# Patient Record
Sex: Female | Born: 1935 | Race: Black or African American | Hispanic: No | Smoking: Never smoker
Health system: Southern US, Community
[De-identification: ages and names within clinical notes are randomized; demographics above are authoritative.]

## PROBLEM LIST (undated history)

## (undated) DIAGNOSIS — I10 Essential (primary) hypertension: Secondary | ICD-10-CM

## (undated) DIAGNOSIS — K219 Gastro-esophageal reflux disease without esophagitis: Secondary | ICD-10-CM

## (undated) DIAGNOSIS — I00 Rheumatic fever without heart involvement: Secondary | ICD-10-CM

## (undated) DIAGNOSIS — M47812 Spondylosis without myelopathy or radiculopathy, cervical region: Secondary | ICD-10-CM

## (undated) DIAGNOSIS — E669 Obesity, unspecified: Secondary | ICD-10-CM

## (undated) DIAGNOSIS — I509 Heart failure, unspecified: Secondary | ICD-10-CM

## (undated) DIAGNOSIS — I2699 Other pulmonary embolism without acute cor pulmonale: Secondary | ICD-10-CM

## (undated) DIAGNOSIS — E785 Hyperlipidemia, unspecified: Secondary | ICD-10-CM

## (undated) HISTORY — DX: Gastro-esophageal reflux disease without esophagitis: K21.9

## (undated) HISTORY — DX: Rheumatic fever without heart involvement: I00

## (undated) HISTORY — DX: Obesity, unspecified: E66.9

## (undated) HISTORY — DX: Hyperlipidemia, unspecified: E78.5

## (undated) HISTORY — DX: Spondylosis without myelopathy or radiculopathy, cervical region: M47.812

## (undated) HISTORY — DX: Essential (primary) hypertension: I10

## (undated) HISTORY — DX: Other pulmonary embolism without acute cor pulmonale: I26.99

## (undated) HISTORY — PX: HERNIA REPAIR: SHX51

## (undated) HISTORY — DX: Heart failure, unspecified: I50.9

---

## 2000-12-11 ENCOUNTER — Ambulatory Visit (HOSPITAL_COMMUNITY): Admission: RE | Admit: 2000-12-11 | Discharge: 2000-12-11 | Payer: Self-pay | Admitting: Family Medicine

## 2000-12-11 ENCOUNTER — Encounter: Payer: Self-pay | Admitting: Family Medicine

## 2002-12-19 ENCOUNTER — Ambulatory Visit (HOSPITAL_COMMUNITY): Admission: RE | Admit: 2002-12-19 | Discharge: 2002-12-19 | Payer: Self-pay | Admitting: Family Medicine

## 2006-02-10 ENCOUNTER — Ambulatory Visit (HOSPITAL_COMMUNITY): Admission: RE | Admit: 2006-02-10 | Discharge: 2006-02-10 | Payer: Self-pay | Admitting: Family Medicine

## 2006-07-31 ENCOUNTER — Ambulatory Visit (HOSPITAL_COMMUNITY): Admission: RE | Admit: 2006-07-31 | Discharge: 2006-07-31 | Payer: Self-pay | Admitting: Family Medicine

## 2007-08-22 ENCOUNTER — Emergency Department (HOSPITAL_COMMUNITY): Admission: EM | Admit: 2007-08-22 | Discharge: 2007-08-22 | Payer: Self-pay | Admitting: Emergency Medicine

## 2007-10-05 ENCOUNTER — Ambulatory Visit (HOSPITAL_COMMUNITY): Admission: RE | Admit: 2007-10-05 | Discharge: 2007-10-05 | Payer: Self-pay | Admitting: Family Medicine

## 2008-01-24 ENCOUNTER — Ambulatory Visit (HOSPITAL_COMMUNITY): Admission: RE | Admit: 2008-01-24 | Discharge: 2008-01-24 | Payer: Self-pay | Admitting: Family Medicine

## 2008-02-10 ENCOUNTER — Encounter: Payer: Self-pay | Admitting: Orthopedic Surgery

## 2008-02-10 ENCOUNTER — Ambulatory Visit (HOSPITAL_COMMUNITY): Admission: RE | Admit: 2008-02-10 | Discharge: 2008-02-10 | Payer: Self-pay | Admitting: Family Medicine

## 2008-02-23 ENCOUNTER — Encounter: Admission: RE | Admit: 2008-02-23 | Discharge: 2008-02-23 | Payer: Self-pay | Admitting: Family Medicine

## 2008-03-13 ENCOUNTER — Encounter: Admission: RE | Admit: 2008-03-13 | Discharge: 2008-03-13 | Payer: Self-pay | Admitting: Family Medicine

## 2008-03-13 ENCOUNTER — Encounter: Payer: Self-pay | Admitting: Orthopedic Surgery

## 2008-03-29 ENCOUNTER — Ambulatory Visit: Payer: Self-pay | Admitting: Cardiology

## 2008-03-29 ENCOUNTER — Inpatient Hospital Stay (HOSPITAL_COMMUNITY): Admission: AD | Admit: 2008-03-29 | Discharge: 2008-04-09 | Payer: Self-pay | Admitting: Family Medicine

## 2008-03-29 ENCOUNTER — Ambulatory Visit (HOSPITAL_COMMUNITY): Admission: RE | Admit: 2008-03-29 | Discharge: 2008-03-29 | Payer: Self-pay | Admitting: Family Medicine

## 2008-03-30 ENCOUNTER — Encounter (INDEPENDENT_AMBULATORY_CARE_PROVIDER_SITE_OTHER): Payer: Self-pay | Admitting: Family Medicine

## 2008-04-04 ENCOUNTER — Ambulatory Visit: Payer: Self-pay | Admitting: Oncology

## 2008-07-12 ENCOUNTER — Ambulatory Visit (HOSPITAL_COMMUNITY): Admission: RE | Admit: 2008-07-12 | Discharge: 2008-07-12 | Payer: Self-pay | Admitting: Family Medicine

## 2008-07-27 ENCOUNTER — Ambulatory Visit (HOSPITAL_COMMUNITY): Admission: RE | Admit: 2008-07-27 | Discharge: 2008-07-27 | Payer: Self-pay | Admitting: Family Medicine

## 2008-10-23 ENCOUNTER — Encounter (INDEPENDENT_AMBULATORY_CARE_PROVIDER_SITE_OTHER): Payer: Self-pay | Admitting: Family Medicine

## 2008-10-23 ENCOUNTER — Ambulatory Visit: Payer: Self-pay | Admitting: Cardiology

## 2008-10-23 ENCOUNTER — Ambulatory Visit (HOSPITAL_COMMUNITY): Admission: RE | Admit: 2008-10-23 | Discharge: 2008-10-23 | Payer: Self-pay | Admitting: Family Medicine

## 2009-01-09 ENCOUNTER — Ambulatory Visit (HOSPITAL_COMMUNITY): Admission: RE | Admit: 2009-01-09 | Discharge: 2009-01-09 | Payer: Self-pay | Admitting: Family Medicine

## 2009-01-18 ENCOUNTER — Ambulatory Visit: Payer: Self-pay | Admitting: Orthopedic Surgery

## 2009-01-18 DIAGNOSIS — M25519 Pain in unspecified shoulder: Secondary | ICD-10-CM

## 2009-06-06 ENCOUNTER — Encounter (INDEPENDENT_AMBULATORY_CARE_PROVIDER_SITE_OTHER): Payer: Self-pay | Admitting: *Deleted

## 2009-06-06 LAB — CONVERTED CEMR LAB
ALT: 13 units/L
Albumin: 4.3 g/dL
Chloride: 105 meq/L
Creatinine, Ser: 1.14 mg/dL
HCT: 38.3 %
HDL: 48 mg/dL
Hemoglobin: 12.2 g/dL
LDL Cholesterol: 84 mg/dL
Platelets: 260 10*3/uL
Potassium: 3.6 meq/L
Triglycerides: 105 mg/dL

## 2009-06-14 ENCOUNTER — Encounter: Payer: Self-pay | Admitting: Cardiology

## 2009-06-21 ENCOUNTER — Ambulatory Visit: Payer: Self-pay | Admitting: Orthopedic Surgery

## 2009-06-21 DIAGNOSIS — M7580 Other shoulder lesions, unspecified shoulder: Secondary | ICD-10-CM

## 2009-06-28 ENCOUNTER — Encounter (INDEPENDENT_AMBULATORY_CARE_PROVIDER_SITE_OTHER): Payer: Self-pay | Admitting: *Deleted

## 2009-07-05 ENCOUNTER — Ambulatory Visit: Payer: Self-pay | Admitting: Cardiology

## 2009-07-05 ENCOUNTER — Encounter (INDEPENDENT_AMBULATORY_CARE_PROVIDER_SITE_OTHER): Payer: Self-pay | Admitting: *Deleted

## 2009-07-05 ENCOUNTER — Telehealth (INDEPENDENT_AMBULATORY_CARE_PROVIDER_SITE_OTHER): Payer: Self-pay

## 2009-07-05 DIAGNOSIS — R0602 Shortness of breath: Secondary | ICD-10-CM

## 2009-07-05 DIAGNOSIS — E669 Obesity, unspecified: Secondary | ICD-10-CM

## 2009-07-05 DIAGNOSIS — K219 Gastro-esophageal reflux disease without esophagitis: Secondary | ICD-10-CM | POA: Insufficient documentation

## 2009-07-05 DIAGNOSIS — E785 Hyperlipidemia, unspecified: Secondary | ICD-10-CM | POA: Insufficient documentation

## 2009-07-05 LAB — CONVERTED CEMR LAB
Brain Natriuretic Peptide: 11.1
TSH: 1.618 microintl units/mL

## 2009-07-06 ENCOUNTER — Encounter: Payer: Self-pay | Admitting: Cardiology

## 2009-07-13 ENCOUNTER — Ambulatory Visit: Payer: Self-pay | Admitting: Internal Medicine

## 2009-07-13 ENCOUNTER — Ambulatory Visit (HOSPITAL_COMMUNITY): Admission: RE | Admit: 2009-07-13 | Discharge: 2009-07-13 | Payer: Self-pay | Admitting: Cardiology

## 2009-07-13 ENCOUNTER — Encounter: Payer: Self-pay | Admitting: Cardiology

## 2009-07-18 ENCOUNTER — Encounter: Payer: Self-pay | Admitting: Cardiology

## 2009-07-27 ENCOUNTER — Encounter: Payer: Self-pay | Admitting: Cardiology

## 2009-08-03 ENCOUNTER — Encounter: Payer: Self-pay | Admitting: *Deleted

## 2009-08-06 ENCOUNTER — Encounter (INDEPENDENT_AMBULATORY_CARE_PROVIDER_SITE_OTHER): Payer: Self-pay | Admitting: *Deleted

## 2009-08-06 ENCOUNTER — Ambulatory Visit: Payer: Self-pay | Admitting: Cardiology

## 2009-08-07 DIAGNOSIS — I2699 Other pulmonary embolism without acute cor pulmonale: Secondary | ICD-10-CM

## 2009-08-13 ENCOUNTER — Ambulatory Visit: Payer: Self-pay | Admitting: Cardiology

## 2009-08-13 ENCOUNTER — Encounter (HOSPITAL_COMMUNITY): Admission: RE | Admit: 2009-08-13 | Discharge: 2009-08-13 | Payer: Self-pay | Admitting: Cardiology

## 2009-08-22 ENCOUNTER — Encounter (INDEPENDENT_AMBULATORY_CARE_PROVIDER_SITE_OTHER): Payer: Self-pay | Admitting: *Deleted

## 2009-08-28 ENCOUNTER — Ambulatory Visit: Payer: Self-pay | Admitting: Cardiology

## 2009-09-24 ENCOUNTER — Encounter: Payer: Self-pay | Admitting: *Deleted

## 2009-09-24 LAB — CONVERTED CEMR LAB
CO2: 26 meq/L (ref 19–32)
Calcium: 9.5 mg/dL
Chloride: 107 meq/L (ref 96–112)
Creatinine, Ser: 0.9 mg/dL (ref 0.40–1.20)
Potassium: 3.7 meq/L
Sodium: 142 meq/L (ref 135–145)

## 2009-10-01 ENCOUNTER — Encounter (INDEPENDENT_AMBULATORY_CARE_PROVIDER_SITE_OTHER): Payer: Self-pay | Admitting: *Deleted

## 2009-10-16 ENCOUNTER — Encounter: Admission: RE | Admit: 2009-10-16 | Discharge: 2009-10-16 | Payer: Self-pay | Admitting: Family Medicine

## 2010-02-03 ENCOUNTER — Encounter: Payer: Self-pay | Admitting: Family Medicine

## 2010-02-04 ENCOUNTER — Encounter: Payer: Self-pay | Admitting: Family Medicine

## 2010-02-12 NOTE — Assessment & Plan Note (Signed)
Summary: rt shoulder pain/needs xr,mri [facility]/medicare/medicaid/[place]/[facility]f   Vital Signs:  Patient profile:   77 year old female Weight:      172 pounds Pulse rate:   80 / minute Resp:     18 per minute  Vitals Entered By: Fuller Canada MD (January 18, 2009 10:39 AM)  Visit Type:  Initial Consult Referring Provider:  Dr. Sudie Bailey Primary Provider:  Dr. Sudie Bailey  CC:  right shoulder pain.  History of Present Illness: I saw [name] in the office today for an initial visit.  She is a 77 years old woman with the complaint of:  chief complaint:  Right shoulder pain, Consult [place].  77 year old female with RIGHT shoulder pain for approximately 1 year. No injury. The pain started in the neck and then traveled to the shoulder. The pain radiates from the shoulder into the deltoid region and it is about a 6/10 worst with raising her arm improved Lorcet plus. She describes the pain. No stiffness in the shoulder. No arm weakness. No catching, locking, or popping. No numbness in the hand or arm.  she does have some neck pain. She is already had an MRI in January of 2010. She had 2 epidural injections, which helped her neck pain.  -xrays done & where:  will have shoulder xrays in our office today.  No injections or therapy.  Meds: Atenox/chlor, Burlene Arnt Kon, Simvastatin, Prednisone, Torsemide, Spironolactone, Nexium, Hydromorphone, Lorcet plus.   Allergies (verified): No Known Drug Allergies  Past History:  Past Medical History: neck pain short of breath htn cholesterol asthma reflux  Past Surgical History: hernia of stomach  Family History: Family History of Diabetes Family History of Arthritis  Social History: Patient is widowed.  retired no smoking no alcohol no caffeine  Review of Systems General:  Complains of fatigue; denies weight loss, weight gain, fever, and chills. Cardiac :  Complains of blood clots; denies chest pain, angina, heart  attack, heart failure, poor circulation, and phlebitis. Resp:  Complains of short of breath; denies difficulty breathing, COPD, cough, and pneumonia. GI:  Complains of reflux; denies nausea, vomiting, diarrhea, constipation, difficulty swallowing, ulcers, and GERD. GU:  Denies kidney failure, kidney transplant, kidney stones, burning, poor stream, testicular cancer, blood in urine, and . Neuro:  Denies headache, dizziness, migraines, numbness, weakness, tremor, and unsteady walking. MS:  Complains of joint pain; denies rheumatoid arthritis, joint swelling, gout, bone cancer, osteoporosis, and . Endo:  Denies thyroid disease, goiter, and diabetes. Psych:  Denies depression, mood swings, anxiety, panic attack, bipolar, and schizophrenia. Derm:  Denies eczema, cancer, and itching. EENT:  Denies poor vision, cataracts, glaucoma, poor hearing, vertigo, ears ringing, sinusitis, hoarseness, toothaches, and bleeding gums. Immunology:  Denies seasonal allergies, sinus problems, and allergic to bee stings. Lymphatic:  Denies lymph node cancer and lymph edema.  Physical Exam  Msk:  Vital signs are stable as recorded.  General appearance is normal.  She has normal pulse, and effusion. Both upper extremities.  Skin in both arms. Normal.  No lymphadenopathy seen along the cervical spine or either axilla.  Patient is alert and oriented x3. Her mood and affect are normal. She has normal reflexes, and coordination and sensation in both upper extremities.  Musculoskeletal findings. She ambulates normally.  She has decreased range of motion in the RIGHT shoulder normal and the LEFT is painful crepitance on range of motion with a positive impingement sign on the RIGHT. There is weakness of the rotator cuff. Grade 5, minus over  5. Shoulder is stable.  The LEFT shoulder as stated has full range of motion without crepitus. Strength is normal. Stability is intact. No tenderness or swelling.   Impression &  Recommendations:  Problem # 1:  IMPINGEMENT SYNDROME (ICD-726.2) Assessment New  Orders: Consultation Level III ([address]) Joint Aspirate / Injection, Large (20610) Depo- Medrol 40mg  (J1030) Shoulder x-ray,  minimum 2 views (60454) normal appearing glenohumeral joint and acromion.  Normal RIGHT shoulder.  Injection RIGHT shoulder Verbal consent obtained/The shoulder was injected with depomedrol 40mg /cc and sensorcaine .25% . There were no complications  Problem # 2:  SHOULDER PAIN (ICD-719.41) Assessment: New  Orders: Consultation Level III (09811) Joint Aspirate / Injection, Large (20610) Depo- Medrol 40mg  (J1030) Shoulder x-ray,  minimum 2 views (91478)  Patient Instructions: 1)  You have received an injection of cortisone today. You may experience increased pain at the injection site. Apply ice pack to the area for 20 minutes every 2 hours and take 2 xtra strength tylenol every 8 hours. This increased pain will usually resolve in 24 hours. The injection will take effect in 3-10 days.  2)  Do exercises for 6 weeks

## 2010-02-12 NOTE — Letter (Signed)
Summary: PROGRESS NOTE 06-05-09 DR KNLOWTON  PROGRESS NOTE 06-05-09 DR KNLOWTON   Imported By: Faythe Ghee 06/14/2009 11:39:56  _____________________________________________________________________  External Attachment:    Type:   Image     Comment:   External Document

## 2010-02-12 NOTE — Letter (Signed)
Summary: [place] [facility], agricultural at [facility]  [address], [place] [address]   Phone: [phone]  Fax: [phone]     August 28, 2009 MRN: [id number]   [name] [address] [place], [place]  [address]      YOUR LAB WORK IS DUE  September 24, 2009 _________________________________________  Please go to [facility], located across the street from [facility] on the second floor.  Hours are Monday - Friday 7am until 7:30pm         Saturday 8am until 12noon    __  DO NOT EAT OR DRINK AFTER MIDNIGHT EVENING PRIOR TO LABWORK  _X_ YOUR LABWORK IS NOT FASTING --YOU MAY EAT PRIOR TO LABWORK

## 2010-02-12 NOTE — Assessment & Plan Note (Signed)
Summary: **NP6 CHEST TIGHTNESS A WEAK   Visit Type:  Follow-up Referring Provider:  Dr. Sudie Bailey Primary Provider:  Dr. Sudie Bailey   History of Present Illness: [name] is seen at the kind request of Dr. Sudie Bailey for evaluation and treatment of dyspnea and fatigue on exertion.  This nice woman has no history of cardiac problems nor has she ever been evaluated by a cardiologist.  Her only serious medical illness occurred in March of 2010 point she presented with dyspnea and bilateral pulmonary infiltrates and was found to have multiple pulmonary emboli.  She has done well since then, but has continued to have exertional dyspnea at the class 2-3 level and fatigue.  She experiences no chest discomfort.  She has no orthopnea nor PND.  She has not noted weight gain or pedal edema.  She is maintained on Coumadin as managed by Dr. Sudie Bailey.  Preventive Screening-Counseling & Management  Alcohol-Tobacco     Smoking Status: never  Current Medications (verified): 1)  Norco 5-325 Mg Tabs (Hydrocodone-Acetaminophen) .Marland Kitchen.. 1 Q 4 As Needed Pain 2)  Atenolol-Chlorthalidone 50-25 Mg Tabs (Atenolol-Chlorthalidone) .... Take 1 Tab Daily 3)  Losartan Potassium 100 Mg Tabs (Losartan Potassium) .... Take 1 Tab Daily 4)  Klor-Con 10 10 Meq Cr-Tabs (Potassium Chloride) .... Take 1 Tab Two Times A Day 5)  Simvastatin 40 Mg Tabs (Simvastatin) .... Take 1 Tab Daily 6)  Nexium 40 Mg Cpdr (Esomeprazole Magnesium) .... Take 1 Tab Daily 7)  Torsemide 100 Mg Tabs (Torsemide) .... Take 1/2 Tab Daily 8)  Spironolactone 25 Mg Tabs (Spironolactone) .... Take 1 Tab Daily  Allergies (verified): No Known Drug Allergies  Past History:  Family History: Last updated: August 01, 2009 Father died at age 62 as the result of a CVA Mother died at age 60, also from CVA Siblings: Of 3 brothers, one died due to myocardial infarction, one secondary to cirrhosis and one as the result of trauma.  5 sisters, one died in  childbirth and one due to myocardial infarction.  3 remain alive.  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  5 sisters, one died in childbirth and one due to myocardial infarction.  3 remain alive.  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  Echocardiogram  Procedure date:  10/23/2008  Findings:      LV-mild LVH; normal systolic function with an EF of 55-60% Possible diastolic flattening of the septum Borderline mitral valve prolapse without significant M. or Mild right ventricular dilatation; mild RVH; normal systolic function.   Review of Systems       Requires corrective lenses; upper dentures; occasional palpitations; gastroesophageal reflux disease symptoms; diffuse arthritic discomfort; intermittent pedal edema.  All of the systems reviewed and are negative.  Vital Signs:  Patient profile:   77 year old female Height:      63 inches Weight:      170 pounds BMI:     30.22 O2 Sat:      92 % on Room air Pulse rate:   70 / minute BP sitting:   161 / 82  (right arm)  Vitals Entered By: Teressa Lower RN (July 05, 2009 1:58 PM)  O2 Flow:  Room air  Physical Exam  General:  Overweight; well-developed; no acute distress: HEENT-[place]/AT; PERRL; EOM intact; conjunctiva and lids nl:  Neck-No JVD; no carotid bruits: Endocrine-mild thyromegaly: Lungs-No tachypnea, clear without rales, rhonchi or wheezes: CV-normal PMI; normal S1 and S2; minimal systolic murmur Abdomen-BS normal; soft and non-tender without masses or organomegaly: MS-No deformities, cyanosis or clubbing: Neurologic-Nl cranial nerves; symmetric strength and tone: Skin- Warm, no sig. lesions: Extremities-Nl distal pulses; 1/2+ ankle edema    Impression & Recommendations:  Problem # 1:  DYSPNEA (ICD-786.05) There is nothing on examination to suggest the presence of congestive heart failure.  When she was admitted to hospital in 2009, BNP level was perfectly normal.  We will proceed with a repeat echocardiogram, a BNP level, other basic laboratory studies and a chest x-ray.  PFTs and an arterial blood gas will also be obtained.  She will likely require a stress test, but that will be deferred for the time being.  Problem # 2:  HYPERLIPIDEMIA (ICD-272.4)  Recent laboratory was good with total cholesterol 153, triglycerides 105, HDL 48 and LDL of 84.  Current therapy will be continued.  Problem # 3:  HYPERTENSION (ICD-401.1) Blood pressure control is somewhat suboptimal, and patient will likely need additional antihypertensive medication.  She may not need 3 different diuretics.  For now, she will monitor blood pressure at home or in local pharmacies and return in 3-4 weeks for review of testing to date, reassessment of the need for antihypertensive therapy and for management of any other issues that arise.  Other Orders: T-TSH [phone]) T-BNP  (B Natriuretic Peptide) [phone]) 2-D Echocardiogram (2D Echo) PFT Baseline (PFT Baseline)  Patient Instructions: 1)  Your physician recommends that you schedule a follow-up appointment  in: 3 to 4 weeks 2)  Your physician recommends that you return for lab work in: today 3)  Your physician has recommended that you have a pulmonary function test.  Pulmonary Function Tests are a group of tests that measure how well air moves in and out of your lungs. 4)  Your physician has recommended that you have ABG's performed along with PFT.  Prevention & Chronic Care Immunizations   Influenza vaccine: Not documented    Tetanus booster: Not documented    Pneumococcal vaccine: Not documented    H. zoster vaccine: Not documented  Colorectal Screening   Hemoccult: Not documented    Colonoscopy: Not documented  Other Screening   Pap smear: Not documented    Mammogram: Not documented    DXA bone density scan: Not documented   Smoking status: never  (07/05/2009)  Lipids   Total Cholesterol: 153  (06/06/2009)   LDL: 84  (06/06/2009)   LDL Direct: Not documented   HDL: 48  (06/06/2009)   Triglycerides: 105  (06/06/2009)    SGOT (AST): 14  (06/06/2009)   SGPT (ALT): 13  (06/06/2009)   Alkaline phosphatase: 54  (06/06/2009)   Total bilirubin: Not documented  Hypertension   Last Blood Pressure: 161 / 82  (07/05/2009)   Serum creatinine: 1.14  (06/06/2009)  Serum potassium 3.6  (06/06/2009)  Self-Management Support :    Hypertension self-management support: Not documented    Lipid self-management support: Not documented

## 2010-02-12 NOTE — Letter (Signed)
Summary: [place] [facility] (Nuc Med Stress)  [facility]  [address] [place] [address]   Phone: [phone]  Fax: [phone]    Nuclear Medicine 1-Day Stress Test Information Sheet  Re:     [name]   DOB:     07/16/1931 MRN:     [id number] Weight:  Appointment Date: Register at: Appointment Time: Referring MD:  _x__Exercise Stress  __Adenosine   __Dobutamine  __Lexiscan  __Persantine   __Thallium  Urgency: ____1 (next day)   ____2 (one week)    ____3 (PRN)  Patient will receive Follow Up call with results: Patient needs follow-up appointment:  Instructions regarding medication: Please do not take your atenolol/chlorthalidone,torsemide,spironolactone,amlodipine the morning of your exam  How to prepare for your stress test: 1. DO NOT eat or dring 6 hours prior to your arrival time. This includes no caffeine (coffee, tea, sodas, chocolate) if you were instructed to take your medications, drink water with it. 2. DO NOT use any tobacco products for at leaset 8 hours prior to arrival. 3. DO NOT wear dresses or any clothing that may have metal clasps or buttons. 4. Wear short sleeve shirts, loose clothing, and comfortalbe walking shoes. 5. DO NOT use lotions, oils or powder on your chest before the test. 6. The test will take approximately 3-4 hours from the time you arrive until completion. 7. To register the day of the test, go to the Short Stay entrance at [facility]. 8. If you must cancel your test, call [phone] as soon as you are aware.  After you arrive for test:   When you arrive at [facility], you will go to Short Stay to be registered. They will then send you to Radiology to check in. The Nuclear Medicine Tech will get you and start an IV in your arm or hand. A small amount of a radioactive tracer will then be injected into your IV. This tracer will then have to circulate for 30-45 minutes. During this time you will wait  in the waiting room and you will be able to drink something without caffeine. A series of pictures will be taken of your heart follwoing this waiting period. After the 1st set of pictures you will go to the stress lab to get ready for your stress test. During the stress test, another small amount of a radioactive tracer will be injected through your IV. When the stress test is complete, there is a short rest period while your heart rate and blood pressure will be monitored. When this monitoring period is complete you will have another set of pictrues taken. (The same as the 1st set of pictures). These pictures are taken between 15 minutes and 1 hour after the stress test. The time depends on the type of stress test you had. Your doctor will inform you of your test results within 7 days after test.    The possibilities of certain changes are possible during the test. They include abnormal blood pressure and disorders of the heart. Side effects of persantine or adenosine can include flushing, chest pain, shortness of breath, stomach tightness, headache and light-headedness. These side effects usually do not last long and are self-resolving. Every effort will be made to keep you comfortable and to minimize complications by obtaining a medical history and by close observation during the test. Emergency equipment, medications, and trained personnel are available to deal with any unusual situation which may arise.  Please notify office at  least 48 hours in advance if you are unable to keep this appt.

## 2010-02-12 NOTE — Assessment & Plan Note (Signed)
Summary: 3-4 wk f/u per checkout on 08/06/09/tg   Visit Type:  Follow-up Referring Provider:  . Primary Provider:  Dr. Sudie Bailey   History of Present Illness: [name] returns to the office as scheduled for continued assessment and treatment of exertional dyspnea and fatigue.  Since her last visit, she feels that she is improved.  She is more active with less dyspnea.  She denies orthopnea or PND.  She has noted very mild ankle edema.  She has not taken warfarin for the past 5 months after a one year course following pulmonary embolism.  She denies chest discomfort, calf enlargement or leg pain.  Current Medications (verified): 1)  Norco 5-325 Mg Tabs (Hydrocodone-Acetaminophen) .Marland Kitchen.. 1 Q 4 As Needed Pain 2)  Atenolol-Chlorthalidone 50-25 Mg Tabs (Atenolol-Chlorthalidone) .... Take 1 Tab Daily 3)  Losartan Potassium 100 Mg Tabs (Losartan Potassium) .... Take 1 Tab Daily 4)  Klor-Con 10 10 Meq Cr-Tabs (Potassium Chloride) .... Take 2 Tab Two Times A Day 5)  Pravastatin Sodium 40 Mg Tabs (Pravastatin Sodium) .... Take One Tablet By Mouth Daily At Bedtime 6)  Nexium 40 Mg Cpdr (Esomeprazole Magnesium) .... Take 1 Tab Daily 7)  Torsemide 100 Mg Tabs (Torsemide) .... Take 1/2 Tab Daily 8)  Spironolactone 25 Mg Tabs (Spironolactone) .... Take 1 Tab Daily 9)  Tylenol Extra Strength 500 Mg Tabs (Acetaminophen) .... Take 1 Tab Daily 10)  Amlodipine Besylate 5 Mg Tabs (Amlodipine Besylate) .... Take One Tablet By Mouth Daily  Allergies (verified): No Known Drug Allergies  Past History:  PMH, FH, and Social History reviewed and updated.  Review of Systems       See history of present illness.  Vital Signs:  Patient profile:   77 year old female Weight:      170 pounds O2 Sat:      92 % on Room air Pulse rate:   69 / minute BP sitting:   134 / 77  (right arm)  Vitals Entered By: Dreama Saa, CNA (August 28, 2009 1:11 PM)  O2 Flow:  Room air  Physical Exam  General:   Overweight; well-developed; no acute distress: HEENT-[place]/AT; PERRL; EOM intact; conjunctiva and lids nl:  Neck-No JVD; no carotid bruits: Endocrine-mild thyromegaly: Lungs-No tachypnea, clear without rales, rhonchi or wheezes: CV-normal PMI; normal S1 and S2; modest systolic murmur Abdomen-BS normal; soft and non-tender without masses or organomegaly: MS-there is deformity of multiple MTP joints, no cyanosis or clubbing: Neurologic-Nl cranial nerves; symmetric strength and tone: Skin- Warm, no sig. lesions: Extremities-Nl distal pulses; 1/2+ ankle edema    Impression & Recommendations:  Problem # 1:  DYSPNEA (ICD-786.05) Symptoms most likely related to excessive weight and physical deconditioning.  There is no evidence for congestive heart failure.  The diagnosis of possible recurrent pulmonary embolism has not been pursued.  We will start with a d-dimer determination.  Problem # 2:  HYPERLIPIDEMIA (ICD-272.4)  Lipid profile was quite good when assessed 2 months ago; current medication will be continued.  Problem # 3:  HYPERTENSION (ICD-401.1) Blood pressure control is good; I will not change her current antihypertensive regimen.  Although she has very mild pedal edema, this is not troublesome to her nor has it progressed since amlodipine was added to her medical regime.  I will plan to reassess this nice woman in 6 weeks.  Other Orders: Future Orders: T-Basic Metabolic Panel [phone]) ... 09/24/2009 T-D-Dimer Fibrin Derivatives Quantitive [phone]) ... 09/24/2009  Patient Instructions: 1)  Your physician recommends that  you schedule a follow-up appointment in: 6 months 2)  Your physician recommends that you return for lab work in: 1 month 3)  Your physician discussed the importance of regular exercise and recommended that you start or continue a regular exercise program for good health. 4)  Your physician encouraged you to lose weight for better health.

## 2010-02-12 NOTE — Miscellaneous (Signed)
Summary: LABS CBCD,CMP,LIPIDS,06/06/2009  Clinical Lists Changes  Observations: Added new observation of CALCIUM: 10.0 mg/dL (19/14/7829 56:21) Added new observation of ALBUMIN: 4.3 g/dL (30/86/5784 69:62) Added new observation of PROTEIN, TOT: 6.3 g/dL (95/28/4132 44:01) Added new observation of SGPT (ALT): 13 units/L (06/06/2009 16:20) Added new observation of SGOT (AST): 14 units/L (06/06/2009 16:20) Added new observation of ALK PHOS: 54 units/L (06/06/2009 16:20) Added new observation of CREATININE: 1.14 mg/dL (02/72/5366 44:03) Added new observation of BUN: 29 mg/dL (47/42/5956 38:75) Added new observation of BG RANDOM: 90 mg/dL (64/33/2951 88:41) Added new observation of CO2 PLSM/SER: 27 meq/L (06/06/2009 16:20) Added new observation of CL SERUM: 105 meq/L (06/06/2009 16:20) Added new observation of K SERUM: 3.6 meq/L (06/06/2009 16:20) Added new observation of NA: 144 meq/L (06/06/2009 16:20) Added new observation of LDL: 84 mg/dL (66/06/3014 01:09) Added new observation of HDL: 48 mg/dL (32/35/5732 20:25) Added new observation of TRIGLYC TOT: 105 mg/dL (42/70/6237 62:83) Added new observation of CHOLESTEROL: 153 mg/dL (15/17/6160 73:71) Added new observation of PLATELETK/UL: 260 K/uL (06/06/2009 16:20) Added new observation of MCV: 81.1 fL (06/06/2009 16:20) Added new observation of HCT: 38.3 % (06/06/2009 16:20) Added new observation of HGB: 12.2 g/dL (07/09/9483 46:27) Added new observation of WBC COUNT: 5.7 10*3/microliter (06/06/2009 16:20)

## 2010-02-12 NOTE — Letter (Signed)
Summary: History form  History form   Imported By: Jacklynn Ganong 01/19/2009 10:42:57  _____________________________________________________________________  External Attachment:    Type:   Image     Comment:   External Document

## 2010-02-12 NOTE — Letter (Signed)
Summary: [place] [facility], agricultural at [facility]  [address] [place] [address]   Phone: [phone]  Fax: [phone]      October 01, 2009 MRN: [id number]   [name] [address] [place], [place]  [address]   Dear Ms. [name],  Your test ordered by Selena Batten has been reviewed by your physician (or physician assistant) and was found to be normal or stable. Your physician (or physician assistant) felt no changes were needed at this time.  ____ Echocardiogram  ____ Cardiac Stress Test  __x__ Lab Work  ____ Peripheral vascular study of arms, legs or neck  ____ CT scan or X-ray  ____ Lung or Breathing test  ____ Other: No change in medical treatment at this time, per Dr. Dietrich Pates.  Enclosed is a copy of your labwork for your records.   Thank you, Tammy Allyne Gee RN    [place] Bing, MD, Lenise Arena.C.Gaylord Shih, MD, F.A.C.C Lewayne Bunting, MD, F.A.C.C Nona Dell, MD, F.A.C.C Charlton Haws, MD, Lenise Arena.C.C

## 2010-02-12 NOTE — Progress Notes (Signed)
**Note De-Identified [name] Obfuscation** Summary: home BP's  Phone Note Outgoing Call   Call placed by: Larita Fife Malaquias Lenker LPN,  July 05, 2009 4:36 PM Summary of Call: No answer, no way to leave message.  Will continue to call.  Per Dr. Dietrich Pates, pt. needs to monitor blood pressure at home or in local pharmacies and bring BP readings to OV in 3 to 4 weeks.  Initial call taken by: Larita Fife Mikayah Joy LPN,  July 05, 2009 4:42 PM  Follow-up for Phone Call        Pt. advised, she states she understands and will bring BP readings to next ov. Follow-up by: Larita Fife Chasady Longwell LPN,  July 06, 2009 9:49 AM

## 2010-02-12 NOTE — Miscellaneous (Signed)
Summary: labs tsh,bnp,tsh,07/05/2009  Clinical Lists Changes  Observations: Added new observation of TSH: 1.618 microintl units/mL (07/05/2009 16:15) Added new observation of BNP: 11.1  (07/05/2009 16:15)

## 2010-02-12 NOTE — Assessment & Plan Note (Signed)
Summary: 3-4 wk f/u per checkout on 07/05/09/tg   Visit Type:  Follow-up Referring Provider:  . Primary Provider:  Dr. Sudie Bailey   History of Present Illness: [name] returns to the office for continuing assessment of exertional dyspnea.  Since her last visit, she notes no change in her symptoms.  She does report a previous hospitalization for congestive heart failure and pulmonary embolism, which was not noted on her initial evaluation. This occurred in March of 2010; records were obtained and reviewed.  She presented with dyspnea and was found to have multiple bilateral pulmonary emboli on CT scanning.  Chest x-ray was interpreted as showing bibasilar infiltrates; however, her CT scan showed only scar and atelectasis in the lower lobes.  BNP level was less than 30.  EF was normal by echocardiography.  Current Medications (verified): 1)  Norco 5-325 Mg Tabs (Hydrocodone-Acetaminophen) .Marland Kitchen.. 1 Q 4 As Needed Pain 2)  Atenolol-Chlorthalidone 50-25 Mg Tabs (Atenolol-Chlorthalidone) .... Take 1 Tab Daily 3)  Losartan Potassium 100 Mg Tabs (Losartan Potassium) .... Take 1 Tab Daily 4)  Klor-Con 10 10 Meq Cr-Tabs (Potassium Chloride) .... Take 2 Tab Two Times A Day 5)  Pravastatin Sodium 40 Mg Tabs (Pravastatin Sodium) .... Take One Tablet By Mouth Daily At Bedtime 6)  Nexium 40 Mg Cpdr (Esomeprazole Magnesium) .... Take 1 Tab Daily 7)  Torsemide 100 Mg Tabs (Torsemide) .... Take 1/2 Tab Daily 8)  Spironolactone 25 Mg Tabs (Spironolactone) .... Take 1 Tab Daily 9)  Tylenol Extra Strength 500 Mg Tabs (Acetaminophen) .... Take 1 Tab Daily 10)  Amlodipine Besylate 5 Mg Tabs (Amlodipine Besylate) .... Take One Tablet By Mouth Daily  Allergies (verified): No Known Drug Allergies  Past History:  PMH, FH, and Social History reviewed and updated.  Past Medical History: Pulmonary embolism ?CHF with preserved left ventricular systolic function, bibasilar atelectasis and infiltrates on  chest x-ray        and a normal BNP level; Rheumatic fever in 1953 Hypertension Hyperlipidemia Gastroesophageal reflux disease Degenerative joint disease of the cervical and lumbosacral spine Remote history of rheumatic fever Allergic rhinitis Obesity  Review of Systems       See history of present illness.  Vital Signs:  Patient profile:   77 year old female Weight:      169 pounds Pulse rate:   57 / minute BP sitting:   126 / 78  (right arm)  Vitals Entered By: Dreama Saa, CNA (August 06, 2009 1:17 PM)  Physical Exam  General:  Overweight; well-developed; no acute distress: HEENT-[place]/AT; PERRL; EOM intact; conjunctiva and lids nl:  Neck-No JVD; no carotid bruits: Endocrine-mild thyromegaly: Lungs-No tachypnea, clear without rales, rhonchi or wheezes: CV-normal PMI; normal S1 and S2; minimal systolic murmur Abdomen-BS normal; soft and non-tender without masses or organomegaly: MS-No deformities, cyanosis or clubbing: Neurologic-Nl cranial nerves; symmetric strength and tone: Skin- Warm, no sig. lesions: Extremities-Nl distal pulses; 1/2+ ankle edema    PFT  Procedure date:  07/18/2009  Findings:                               PULMONARY FUNCTION TEST      REASON FOR PULMONARY FUNCTION TESTING:  Dyspnea.      1. Spirometry shows no ventilatory defect and no evidence of airflow obstruction.   2. Lung volumes show no reduction in total lung capacity, but do show evidence of air trapping.   3. DLCO is  mildly reduced.   4. Arterial blood gases normal.   5. There is improvement with inhaled bronchodilator that does reach       the level of significance.   6. Despite no airflow obstruction on spirometry, the fact that there       is improvement with inhaled bronchodilator and evidence of air       trapping, may indicate that there is some airflow obstruction       present.  Clinical correlation is suggested.               [name], M.D.           Impression & Recommendations:  Problem # 1:  PULMONARY EMBOLISM (ICD-415.19) Patient is no longer being treated with warfarin 2.5 years following pulmonary embolism.  She has not utilizing any precautions whatsoever.  Problem # 2:  HYPERTENSION (ICD-401.1) Blood pressure has been variable at home.  Of approximately 30 measurements supplied by the patient, 4 have diastolics around 120 and 5 systolics greater than 135.  She is concerned about the accuracy of her blood pressure device, which she brings with her.  We will compare this to one of our standard sphygmomanometers.  Amlodipine 5 mg q.d. will be added to her regime.  Problem # 3:  HYPERLIPIDEMIA (ICD-272.4) Due to a potential interaction between simvastatin and amlodipine, pravastatin will be substituted at a dose of 40 mg q.d.  Problem # 4:  DYSPNEA (ICD-786.05) Etiology of symptoms is uncertain.  Patient has multiple cardiovascular risk factors and certainly could have coronary disease.  We will proceed with a stress nuclear study.  I will reassess this nice woman within the next few weeks.  Other Orders: Nuclear Stress Test (Nuc Stress Test)  Patient Instructions: 1)  Your physician recommends that you schedule a follow-up appointment in: 3-4 weeks 2)  Your physician has recommended you make the following change in your medication: stop simvastatin and begin pravastatin 40mg  daily, amlodipine 5mg  daily 3)  Your physician has requested that you have an exercise stress myoview.  For further information please visit [email or web address]  Please follow instruction sheet, as given. Prescriptions: AMLODIPINE BESYLATE 5 MG TABS (AMLODIPINE BESYLATE) Take one tablet by mouth daily  #30 x 6   Entered by:   Teressa Lower RN   Authorized by:   Kathlen Brunswick, MD, [facility]   Signed by:   Teressa Lower RN on 08/06/2009   Method used:   Electronically to        [facility] [phone]* (retail)       [address]       [place], [place]  [address]       Ph: [phone] or [phone]       Fax: [phone]   RxID:   [id number] PRAVASTATIN SODIUM 40 MG TABS (PRAVASTATIN SODIUM) Take one tablet by mouth daily at bedtime  #30 x 6   Entered by:   Teressa Lower RN   Authorized by:   Kathlen Brunswick, MD, [facility]   Signed by:   Teressa Lower RN on 08/06/2009   Method used:   Electronically to        [facility] [phone]* (retail)       [address]       [place], [place]  [address]       Ph: [phone] or [phone]  Fax: [phone]   RxID:   [id number]   Prevention & Chronic Care Immunizations   Influenza vaccine: Not documented    Tetanus booster: Not documented    Pneumococcal vaccine: Not documented    H. zoster vaccine: Not documented  Colorectal Screening   Hemoccult: Not documented    Colonoscopy: Not documented  Other Screening   Pap smear: Not documented    Mammogram: Not documented    DXA bone density scan: Not documented   Smoking status: never  (07/05/2009)  Lipids   Total Cholesterol: 153  (06/06/2009)   LDL: 84  (06/06/2009)   LDL Direct: Not documented   HDL: 48  (06/06/2009)   Triglycerides: 105  (06/06/2009)    SGOT (AST): 14  (06/06/2009)   SGPT (ALT): 13  (06/06/2009)   Alkaline phosphatase: 54  (06/06/2009)   Total bilirubin: Not documented  Hypertension   Last Blood Pressure: 126 / 78  (08/06/2009)   Serum creatinine: 1.14  (06/06/2009)   Serum potassium 3.6  (06/06/2009)  Self-Management Support :    Hypertension self-management support: Not documented    Lipid self-management support: Not documented

## 2010-02-12 NOTE — Assessment & Plan Note (Signed)
Summary: RT SHOULDER HURTING AGAIN/XR HERE 01/18/09/[facility]MED...   Visit Type:  Follow-up Referring Provider:  Dr. Sudie Bailey Primary Provider:  Dr. Sudie Bailey  CC:  right shoulder pain.  History of Present Illness: I saw [name] in the office today for a followup visit.  She is a 77 years old woman with the complaint of:  right shoulder pain.  Last seen on 01-18-09 for this problem and received an injection.  She states that the shot helped her for about 3 months.  MEDS: HYDROMORPHONE 4 MG [1/2 TAB]  C/O PAIN IN THE SHOULDER JOINT RADIATING TO THE BICEPS   INITIAL HISTORY: 77 year old female with RIGHT shoulder pain for approximately 1 year. No injury. The pain started in the neck and then traveled to the shoulder. The pain radiates from the shoulder into the deltoid region and it is about a 6/10 worst with raising her arm improved Lorcet plus. She describes the pain. No stiffness in the shoulder. No arm weakness. No catching, locking, or popping. No numbness in the hand or arm.  TREATMENT: INJECTION   Allergies: No Known Drug Allergies  Review of Systems       CARDIOLOGY CONSULT REC FOR TIREDNESS AND FATIGUE   Physical Exam  Skin:  intact without lesions or rashes Cervical Nodes:  no significant adenopathy Psych:  alert and cooperative; normal mood and affect; normal attention span and concentration   Shoulder/Elbow Exam  General:    Well-developed, well-nourished, normal body habitus; no deformities, normal grooming.    Inspection:    Inspection is normal.    Palpation:    tenderness R-AC: and tenderness R-deltoid:POSTERIOR AND ANTEROLATERAL   Vascular:    Radial, ulnar, brachial, and axillary pulses 2+ and symmetric; capillary refill less than 2 seconds; no evidence of ischemia, clubbing, or cyanosis.    Sensory:    Gross sensation intact in the upper extremities.    Motor:    decreased R-supraspinatus.    Shoulder Exam:    Right:  Stability:  stable    Range of Motion:       Flexion-Active: 120       Flexion-Passive: 175+ PAIN   Impingement Sign NEER:    Right positive Apprehension Sign:    Right negative Sulcus Sign:    Right negative   Impression & Recommendations:  Problem # 1:  IMPINGEMENT SYNDROME (ICD-726.2) Assessment Deteriorated  LAST XRAYS SHOW ACROMION TYPE 2   Verbal consent obtained/The shoulder was injected with depomedrol 40mg /cc and sensorcaine .25% . There were no complications  Orders: Est. Patient Level III (09811) Depo- Medrol 40mg  (J1030) Joint Aspirate / Injection, Large (20610)  Medications Added to Medication List This Visit: 1)  Norco 5-325 Mg Tabs (Hydrocodone-acetaminophen) .Marland Kitchen.. 1 q 4 as needed pain  Patient Instructions: 1)  You have received an injection of cortisone today. You may experience increased pain at the injection site. Apply ice pack to the area for 20 minutes every 2 hours and take 2 xtra strength tylenol every 8 hours. This increased pain will usually resolve in 24 hours. The injection will take effect in 3-10 days.  2)  Please schedule a follow-up appointment as needed. Prescriptions: NORCO 5-325 MG TABS (HYDROCODONE-ACETAMINOPHEN) 1 q 4 as needed pain  #42 x 5   Entered and Authorized by:   Fuller Canada MD   Signed by:   Fuller Canada MD on 06/21/2009   Method used:   Print then Give to Patient   RxID:   [id number]

## 2010-02-12 NOTE — Letter (Signed)
Summary: *Orthopedic Consult Note  [facility]  [address]  [place], [place] [address]   Phone: [phone]  Fax: [phone]    Re:    [name] DOB:    1931-10-03   Dear: Brett Canales   Thank you for requesting that we see the above patient for consultation.  A copy of the detailed office note will be sent under separate cover, for your review.  Evaluation today is consistent with: bursitis and impingement syndrome, RIGHT shoulder   Our recommendation is for: injection and home exercise program, continue Lorcet plus       Thank you for this opportunity to look after your patient.  Sincerely,   Terrance Mass. MD.

## 2010-03-27 ENCOUNTER — Encounter: Payer: Self-pay | Admitting: *Deleted

## 2010-03-28 ENCOUNTER — Ambulatory Visit (INDEPENDENT_AMBULATORY_CARE_PROVIDER_SITE_OTHER): Payer: Medicare Other | Admitting: Cardiology

## 2010-03-28 ENCOUNTER — Encounter: Payer: Self-pay | Admitting: Cardiology

## 2010-03-28 DIAGNOSIS — R0602 Shortness of breath: Secondary | ICD-10-CM

## 2010-03-31 LAB — BLOOD GAS, ARTERIAL
Bicarbonate: 23.5 mEq/L (ref 20.0–24.0)
O2 Saturation: 98.3 %
TCO2: 21.2 mmol/L (ref 0–100)
pH, Arterial: 7.439 — ABNORMAL HIGH (ref 7.350–7.400)

## 2010-04-02 NOTE — Miscellaneous (Signed)
Summary: labs d-dimer,bmp,09/24/2009( d-dimer is 2.25)  Clinical Lists Changes  Observations: Added new observation of CALCIUM: 9.5 mg/dL (45/40/9811 91:47) Added new observation of CREATININE: 0.90 mg/dL (82/95/6213 08:65) Added new observation of BUN: 17 mg/dL (78/46/9629 52:84) Added new observation of BG RANDOM: 81 mg/dL (13/24/4010 27:25) Added new observation of CO2 PLSM/SER: 26 meq/L (09/24/2009 15:13) Added new observation of CL SERUM: 107 meq/L (09/24/2009 15:13) Added new observation of K SERUM: 3.7 meq/L (09/24/2009 15:13) Added new observation of NA: 142 meq/L (09/24/2009 15:13)

## 2010-04-10 ENCOUNTER — Other Ambulatory Visit: Payer: Self-pay | Admitting: Cardiology

## 2010-04-11 NOTE — Telephone Encounter (Signed)
Happy °

## 2010-04-11 NOTE — Assessment & Plan Note (Signed)
Summary: 6 month f/u/tmj   Visit Type:  Follow-up Referring Provider:  . Primary Provider:  Dr. Sudie Bailey   History of Present Illness: [name] returns to the office for continued assessment and treatment of exertional dyspnea.  Since her last visit, she has markedly improved.  She has been walking regularly and has added upper extremity exercises as well.  Exercise tolerance has gradually improved, and dyspnea has gradually waned.  She has not been provided with any specific dietary information and has not modified her food intake.  On review of dietary choices, she is avoiding high-fat items, but not necessarily high caloric ones.  She drinks soda and sweet tea.  Current Medications (verified): 1)  Norco 5-325 Mg Tabs (Hydrocodone-Acetaminophen) .Marland Kitchen.. 1 Q 4 As Needed Pain 2)  Atenolol-Chlorthalidone 50-25 Mg Tabs (Atenolol-Chlorthalidone) .... Take 1 Tab Daily 3)  Losartan Potassium 100 Mg Tabs (Losartan Potassium) .... Take 1 Tab Daily 4)  Klor-Con 10 10 Meq Cr-Tabs (Potassium Chloride) .... Take 2 Tab Two Times A Day 5)  Pravastatin Sodium 40 Mg Tabs (Pravastatin Sodium) .... Take One Tablet By Mouth Daily At Bedtime 6)  Nexium 40 Mg Cpdr (Esomeprazole Magnesium) .... Take 1 Tab Daily 7)  Torsemide 100 Mg Tabs (Torsemide) .... Take 1/2 Tab Daily 8)  Spironolactone 25 Mg Tabs (Spironolactone) .... Take 1 Tab Daily 9)  Tylenol Extra Strength 500 Mg Tabs (Acetaminophen) .... Take 1 Tab Daily 10)  Amlodipine Besylate 5 Mg Tabs (Amlodipine Besylate) .... Take One Tablet By Mouth Daily 11)  Naprosyn 500 Mg Tabs (Naproxen) .... Take 1 Tab Two Times A Day  Allergies (verified): No Known Drug Allergies  Comments:  Nurse/Medical Assistant: patient brought meds she uses [facility] in [place]  Vital Signs:  Patient profile:   77 year old female Weight:      172 pounds BMI:     30.58 Pulse rate:   65 / minute BP sitting:   116 / 64  (left arm)  Vitals Entered By: Dreama Saa, CNA (March 28, 2010 2:00 PM)  Physical Exam  General:  Overweight; well-developed; no acute distress: Weight-172, 2 pounds increased since her last visit HEENT-[place]/AT; PERRL; EOM intact; conjunctiva and lids nl:  Neck-No JVD; no carotid bruits: Endocrine-mild thyromegaly: Lungs-No tachypnea, clear without rales, rhonchi or wheezes: CV-normal PMI; normal S1 and S2; modest systolic murmur Abdomen-BS normal; soft and non-tender without masses or organomegaly: MS-there is deformity of multiple MTP joints, no cyanosis or clubbing: Neurologic-Nl cranial nerves; symmetric strength and tone: Skin- Warm, no sig. lesions: Extremities-Nl distal pulses; trace+ ankle edema    Impression & Recommendations:  Problem # 1:  DYSPNEA (ICD-786.05) Patient's course suggests that symptoms were originally due to physical deconditioning.  She is encouraged to continue to increase exercise as tolerated and to lose weight.  Problem # 2:  OBESITY (ICD-278.00) As a first approach to caloric restriction, I suggested that she no longer drink soft drinks or sweet tea and decrease her intake of other sweets.  We have also provided her with information regarding the DASH diet.  Since there are no apparent active cardiology issues, I will not plan a routine followup visit, but will be happy to see this nice woman at any time Dr. Sudie Bailey believes I can offer assistance in her care.  Patient Instructions: 1)  Your physician recommends that you schedule a follow-up appointment in: AS NEEDED  2)  DASH DIET

## 2010-04-25 LAB — CBC
HCT: 33.6 % — ABNORMAL LOW (ref 36.0–46.0)
HCT: 35.6 % — ABNORMAL LOW (ref 36.0–46.0)
Hemoglobin: 11 g/dL — ABNORMAL LOW (ref 12.0–15.0)
Hemoglobin: 11.6 g/dL — ABNORMAL LOW (ref 12.0–15.0)
Hemoglobin: 11.7 g/dL — ABNORMAL LOW (ref 12.0–15.0)
MCHC: 32.3 g/dL (ref 30.0–36.0)
MCHC: 33 g/dL (ref 30.0–36.0)
MCV: 85.6 fL (ref 78.0–100.0)
MCV: 86.2 fL (ref 78.0–100.0)
Platelets: 158 10*3/uL (ref 150–400)
Platelets: 165 10*3/uL (ref 150–400)
Platelets: 191 10*3/uL (ref 150–400)
RBC: 4.16 MIL/uL (ref 3.87–5.11)
RBC: 4.16 MIL/uL (ref 3.87–5.11)
RBC: 4.34 MIL/uL (ref 3.87–5.11)
RBC: 4.76 MIL/uL (ref 3.87–5.11)
RDW: 18 % — ABNORMAL HIGH (ref 11.5–15.5)
RDW: 18.4 % — ABNORMAL HIGH (ref 11.5–15.5)
WBC: 5.8 10*3/uL (ref 4.0–10.5)
WBC: 5.9 10*3/uL (ref 4.0–10.5)
WBC: 6.6 10*3/uL (ref 4.0–10.5)

## 2010-04-25 LAB — DIFFERENTIAL
Basophils Absolute: 0 10*3/uL (ref 0.0–0.1)
Basophils Absolute: 0 10*3/uL (ref 0.0–0.1)
Basophils Relative: 0 % (ref 0–1)
Basophils Relative: 1 % (ref 0–1)
Eosinophils Absolute: 0.1 10*3/uL (ref 0.0–0.7)
Eosinophils Absolute: 0.1 10*3/uL (ref 0.0–0.7)
Eosinophils Absolute: 0.1 10*3/uL (ref 0.0–0.7)
Eosinophils Relative: 1 % (ref 0–5)
Eosinophils Relative: 2 % (ref 0–5)
Eosinophils Relative: 2 % (ref 0–5)
Lymphocytes Relative: 35 % (ref 12–46)
Lymphocytes Relative: 36 % (ref 12–46)
Lymphs Abs: 2 10*3/uL (ref 0.7–4.0)
Lymphs Abs: 2 10*3/uL (ref 0.7–4.0)
Lymphs Abs: 2.1 10*3/uL (ref 0.7–4.0)
Lymphs Abs: 2.2 10*3/uL (ref 0.7–4.0)
Monocytes Absolute: 0.5 10*3/uL (ref 0.1–1.0)
Monocytes Absolute: 0.6 10*3/uL (ref 0.1–1.0)
Monocytes Absolute: 0.7 10*3/uL (ref 0.1–1.0)
Monocytes Relative: 10 % (ref 3–12)
Monocytes Relative: 11 % (ref 3–12)
Neutro Abs: 3.1 10*3/uL (ref 1.7–7.7)
Neutro Abs: 4.6 10*3/uL (ref 1.7–7.7)
Neutrophils Relative %: 53 % (ref 43–77)
Neutrophils Relative %: 58 % (ref 43–77)

## 2010-04-25 LAB — COMPREHENSIVE METABOLIC PANEL
ALT: 31 U/L (ref 0–35)
AST: 22 U/L (ref 0–37)
Albumin: 3 g/dL — ABNORMAL LOW (ref 3.5–5.2)
CO2: 31 mEq/L (ref 19–32)
Calcium: 9 mg/dL (ref 8.4–10.5)
Chloride: 103 mEq/L (ref 96–112)
GFR calc Af Amer: >60 mL/min (ref >60)
GFR calc non Af Amer: 50 mL/min — ABNORMAL LOW (ref >60)
Sodium: 140 mEq/L (ref 135–145)
Total Bilirubin: 0.6 mg/dL (ref 0.3–1.2)

## 2010-04-25 LAB — LUPUS ANTICOAGULANT PANEL
DRVVT: 43.5 secs (ref 36.1–47.0)
Lupus Anticoagulant: NOT DETECTED

## 2010-04-25 LAB — FACTOR 5 LEIDEN

## 2010-04-25 LAB — BASIC METABOLIC PANEL
BUN: 23 mg/dL (ref 6–23)
BUN: 28 mg/dL — ABNORMAL HIGH (ref 6–23)
BUN: 32 mg/dL — ABNORMAL HIGH (ref 6–23)
CO2: 31 mEq/L (ref 19–32)
Calcium: 9.4 mg/dL (ref 8.4–10.5)
Chloride: 104 mEq/L (ref 96–112)
Chloride: 107 mEq/L (ref 96–112)
Creatinine, Ser: 1.21 mg/dL — ABNORMAL HIGH (ref 0.4–1.2)
GFR calc Af Amer: 53 mL/min — ABNORMAL LOW (ref >60)
GFR calc non Af Amer: 57 mL/min — ABNORMAL LOW (ref >60)
GFR calc non Af Amer: >60 mL/min (ref >60)
Glucose, Bld: 106 mg/dL — ABNORMAL HIGH (ref 70–99)
Glucose, Bld: 119 mg/dL — ABNORMAL HIGH (ref 70–99)
Potassium: 3.7 mEq/L (ref 3.5–5.1)
Potassium: 3.9 mEq/L (ref 3.5–5.1)
Sodium: 137 mEq/L (ref 135–145)
Sodium: 139 mEq/L (ref 135–145)

## 2010-04-25 LAB — BLOOD GAS, ARTERIAL
Acid-Base Excess: 2.3 mmol/L — ABNORMAL HIGH (ref 0.0–2.0)
pCO2 arterial: 35.2 mmHg (ref 35.0–45.0)
pO2, Arterial: 66.3 mmHg — ABNORMAL LOW (ref 80.0–100.0)

## 2010-04-25 LAB — PROTIME-INR
INR: 1 (ref 0.00–1.49)
INR: 1 (ref 0.00–1.49)
INR: 2.2 — ABNORMAL HIGH (ref 0.00–1.49)
INR: 2.3 — ABNORMAL HIGH (ref 0.00–1.49)
Prothrombin Time: 12.8 seconds (ref 11.6–15.2)
Prothrombin Time: 13.2 seconds (ref 11.6–15.2)
Prothrombin Time: 25.6 seconds — ABNORMAL HIGH (ref 11.6–15.2)
Prothrombin Time: 27.6 seconds — ABNORMAL HIGH (ref 11.6–15.2)

## 2010-04-25 LAB — CARDIAC PANEL(CRET KIN+CKTOT+MB+TROPI)
Total CK: 132 U/L (ref 7–177)
Troponin I: 0.03 ng/mL (ref 0.00–0.06)

## 2010-04-25 LAB — MAGNESIUM: Magnesium: 2.4 mg/dL (ref 1.5–2.5)

## 2010-04-25 LAB — BETA-2-GLYCOPROTEIN I ABS, IGG/M/A: Beta-2 Glyco I IgG: <4 U/mL (ref <20)

## 2010-04-25 LAB — CARDIOLIPIN ANTIBODIES, IGG, IGM, IGA
Anticardiolipin IgA: 9 [APL'U] — ABNORMAL LOW (ref <13)
Anticardiolipin IgM: <7 [MPL'U] — ABNORMAL LOW (ref <10)

## 2010-04-30 ENCOUNTER — Other Ambulatory Visit: Payer: Self-pay | Admitting: Cardiology

## 2010-05-28 NOTE — Group Therapy Note (Signed)
NAME:  [name], [name] NO.:  [id number]   MEDICAL RECORD NO.:  [id number]          PATIENT TYPE:  INP   LOCATION:  A216                          FACILITY:  [facility]   PHYSICIAN:  Catalina Pizza, M.D.        DATE OF BIRTH:  05-Aug-1931   DATE OF PROCEDURE:  04/01/2008  DATE OF DISCHARGE:                                 PROGRESS NOTE   SUBJECTIVE:  Ms. Selley is a 77 year old African female who was  admitted for shortness of breath and found to have some congestive heart  failure by CT scan obtained yesterday, revealed that she has had  multiple small pulmonary emboli which would likely was contributing to  her shortness of breath.  At this time, she feels much better, has no  complaints.  Did not report any lower extremity pain or swelling  indicative of DVT, so unknown exact source for the small emboli.   OBJECTIVE:  VITAL SIGNS:  Temperature is 98.7, blood pressure 108/58,  pulse 80, respirations 20, satting 95-96% on 2 liters of oxygen.  GENERAL:  This is a African female sitting in chair in no acute stress.  HEENT:  Unremarkable.  LUNGS:  Clear to auscultation bilaterally.  No rhonchi or wheezing.  Did  not appreciate crackles currently.  HEART:  Regular rate and rhythm.  No murmurs, gallops or rubs.  ABDOMEN:  Soft, nontender, nondistended, positive bowel sounds.  EXTREMITIES:  No lower extremity edema.  Did thoroughly exam lower  extremities feeling for any types of cords or swelling and did not  appreciate any at this time.  NEUROLOGIC:  The patient is alert and oriented x3, very pleasant, moving  all extremities.  No acute distress.  Is alert and not having any  neurologic deficits.   LABORATORY DATA:  CBC showed white count of 6.6, hemoglobin of 12.1,  platelet count of 165, BNP was less than 30, magnesium was 2.4.  C-met  shows sodium of 140, potassium 3.4, chloride of 103, CO2 31, glucose  120, BUN 31, creatinine 1.07.  Total bili of 0.6, alk phos 46, SGOT  of  22, SGPT of 31, total protein 5.3, albumin of 3.0, calcium of 9.0.  CT  of chest showed multiple small pulmonary emboli involving right upper  lobe, right lower lobe, left upper lobe, and minimal dependent  atelectasis, thickening at left ventricular wall.   STUDIES:  A 2-D echo findings revealed overall left ventricular systolic  function was hyperdynamic, EF was estimated at 80%.  No regional wall  motion abnormalities, some moderate right ventricular hypertrophy.   IMPRESSION:  This is a 77 year old African female with new onset of  pulmonary emboli resulting in diastolic dysfunction, heart failure very  likely.   ASSESSMENT/PLAN:  1. Pulmonary emboli, multiple small pulmonary emboli noted.  Unclear      source of this, but at this time we will fully anticoagulated the      patient anyway with Lovenox and start her on Coumadin today.  Will      likely need a 5-day coverage past this with Lovenox once  therapeutic.  2. Hypokalemia.  Very mild hypokalemia.  Will replete with potassium      currently.  3. Hypertension.  She is not resumed back on all her medicines, but is      doing well with her of blood pressure currently.   DISPOSITION:  The patient will continue in the hospital while getting  therapeutic on her Coumadin, maybe able to get set up for home health as  outpatient on Monday for discharge, but will defer to Dr. Sudie Bailey for  that.      Catalina Pizza, M.D.  Electronically Signed     ZH/MEDQ  D:  04/01/2008  T:  04/01/2008  Job:  [id number]

## 2010-05-28 NOTE — Group Therapy Note (Signed)
NAME:  [name], GRETHER NO.:  [id number]   MEDICAL RECORD NO.:  [id number]          PATIENT TYPE:  INP   LOCATION:  A216                          FACILITY:  [facility]   PHYSICIAN:  Catalina Pizza, M.D.        DATE OF BIRTH:  [birth year]   DATE OF PROCEDURE:  04/02/2008  DATE OF DISCHARGE:                                 PROGRESS NOTE   SUBJECTIVE:  [name] is a 77 year old African American female  with shortness of breath, initially thought to be CHF which was very  likely, but she was also found to have multiple small pulmonary emboli.  She has not had any further issues related to shortness of breath.  She  states she is not having any chest pains currently.  She is eating and  drinking well.  No other issues.   OBJECTIVE:  VITAL SIGNS:  Temperature is 98.3, blood pressure 125/70,  pulse of 82, respiration 20, and 99% on 2 L of oxygen.  Check yesterday  afternoon showed 97% on room air.  GENERAL:  This is an [place] American female lying in bed in no acute  distress.  HEENT:  Unremarkable.  LUNGS:  Clear to auscultation bilaterally.  No rhonchi or wheezing.  Not  appreciating crackles currently.  HEART:  Regular rate and rhythm.  ABDOMEN:  Soft, nontender, positive bowel sounds.  EXTREMITIES:  No lower extremity edema.  NEUROLOGIC:  Patient is alert and oriented x3, very pleasant, moving all  extremities.   LABORATORY DATA:  CBC shows white count of 5.9, hemoglobin 11.6,  platelet count 154.  BMET shows sodium of 137, potassium of 3.9,  chloride 104, CO2 of 28, glucose 129, BUN of 32, creatinine 0.96,  calcium of 8.9, PT 13.2, INR 1.   IMPRESSION:  This is a 77 year old African American female with new  onset of multiple small pulmonary emboli and some diastolic dysfunction  resulting in some heart failure.   ASSESSMENT/PLAN:  1. Pulmonary emboli.  She was started on full anticoagulation of      Lovenox 2 days ago and started on Coumadin yesterday.  We  will      continue to monitor and await her to become therapeutic on Coumadin      for an overlap of Lovenox.  2. Hypokalemia.  She is not having any signs of hypokalemia currently.      She was repleted yesterday.  3. Hypertension.  Doing well.  She was not resumed on her medicines      secondary to her blood pressure being okay.   DISPOSITION:  Awaiting her to become therapeutic on Coumadin.  The  patient is not having any other issues.  I will continue to monitor and  may be able to make her an outpatient with home health overlap.  Will  defer to Dr. Sudie Bailey for that followup.      Catalina Pizza, M.D.  Electronically Signed     ZH/MEDQ  D:  04/02/2008  T:  04/02/2008  Job:  [id number]

## 2010-05-28 NOTE — Group Therapy Note (Signed)
[name], BIANCARDI NO.:  [id number]   MEDICAL RECORD NO.:  [id number]          PATIENT TYPE:  INP   LOCATION:  A319                          FACILITY:  [facility]   PHYSICIAN:  Mila Homer. Sudie Bailey, M.D.DATE OF BIRTH:  [birth year]   DATE OF PROCEDURE:  DATE OF DISCHARGE:                                 PROGRESS NOTE   SUBJECTIVE:  She is gradually improving.  She was able to walk around  yesterday without oxygen, although she did feel little bit of shortness  of breath with this.  She had her mammogram.   OBJECTIVE:  VITAL SIGNS:  Temperature is 98.9, pulse 77, respiratory  rate 20, blood pressure 115/69.  O2 sats 91%.  GENERAL:  She is sitting up in a chair, in no acute distress, well  developed, somewhat obese, oriented and alert.  LUNGS:  Actually clear throughout.  There are no intercostal  retractions.  There is no use of accessory muscle respiration.  HEART:  Regular rhythm without murmur, rate of 80.  EXTREMITIES:  There is no edema of the ankles.   Mammographic results are unavailable in the electronic medical record  and the INR is pending.   PLAN:  Continue with the Lovenox subcu and the Coumadin.  Hopefully, we  will be able to send her home in several days once her INR is in the 2.5  to 3 range.  We discussed colonoscopy looking for occult malignancy, we  will do this outpatient once her O2 sats are up in the 95% or 96% range  and she is totally asymptomatic, but we will need to get her off  anticoagulation for a day or 2 at that time.  So probably it will be a  month or 2 until we do that.      Mila Homer. Sudie Bailey, M.D.  Electronically Signed     SDK/MEDQ  D:  04/06/2008  T:  04/06/2008  Job:  [id number]

## 2010-05-28 NOTE — Group Therapy Note (Signed)
NAMEJULLIAN, [name] NO.:  [id number]   MEDICAL RECORD NO.:  [id number]          PATIENT TYPE:  INP   LOCATION:  A319                          FACILITY:  [facility]   PHYSICIAN:  Mila Homer. Sudie Bailey, M.D.DATE OF BIRTH:  [birth year]   DATE OF PROCEDURE:  DATE OF DISCHARGE:                                 PROGRESS NOTE   SUBJECTIVE:  The patient continues to feel better.  She is now being  seen by Dr. Mariel Sleet, Hem/Onc.   OBJECTIVE:  Temperature 98.6, pulse 76, respiratory rate 20, blood  pressure 126/75.  She is supine in bed, oriented and alert.  No acute  distress, well-developed, and obese.  She is breathing easily.  Her  lungs are clear throughout moving air well.  There is no intercostal  retractions.  No use of accessory muscles for respiration.  The heart is  regular rhythm without murmur.  Abdomen is soft.  There is no edema of  the ankles.  O2 sats 97% on room air and INR is 1.3.   She has had abdominal and pelvic CT was essentially normal except for  probable 5-cm uterine fibroid.  Mammograms are pending.   ASSESSMENT:  1. Pulmonary emboli.  2. Anticoagulation.  3. Question hypercoagulable syndrome.   PLAN:  Continue with the Coumadin 10 mg today.  Discontinue her Lasix.  Continue her enalapril and carvedilol.  Review the note from Dr.  Mariel Sleet.  He has recommended the CT of the abdomen and pelvis as noted  above to rule out occult malignancy and also recommended colonoscopy in  the future.  We will make O2 today p.r.n.      Mila Homer. Sudie Bailey, M.D.  Electronically Signed     SDK/MEDQ  D:  04/05/2008  T:  04/05/2008  Job:  [id number]

## 2010-05-28 NOTE — Consult Note (Signed)
[name], [name] NO.:  [id number]   MEDICAL RECORD NO.:  [id number]          PATIENT TYPE:  INP   LOCATION:  A216                          FACILITY:  [facility]   PHYSICIAN:  Meredith Mends, MD      DATE OF BIRTH:  25-Oct-1931   DATE OF CONSULTATION:  03/31/2008  DATE OF DISCHARGE:                                 CONSULTATION   REQUESTING PHYSICIAN:  Meredith Homer. Sudie Bailey, MD   REASON FOR CONSULTATION:  Congestive heart failure.   HISTORY OF PRESENT ILLNESS:  This is a 77 year old lady who presented to  [name] office with complaints of dyspnea.  She noticed shortness  of breath first 6 weeks ago.  Subsequently, her symptoms progressively  worsened and she was acutely out of breath.   REVIEW OF SYSTEMS:  [name] denies chest discomfort and chest  pain, lightheadedness, dizziness, and syncope.  She noted some lower  extremity edema and denies orthopnea.   [name] has no history of cardiovascular disease or stroke.  Her  risk factors include hypertension and dyslipidemia.  She had a history  of rheumatic fever in 1953 by a report.   MEDICATIONS:  1. Cozaar 100 mg p.o. daily.  2. KCl 10 mEq p.o. daily.  3. Atenolol.  4. Chlorthalidone 50/25 mg p.o. daily.  5. Nexium.  6. Simvastatin 40 mg p.o. daily.  7. Aspirin.  8. Nabumetone.   INPATIENT HOSPITAL MEDICATIONS:  1. Aspirin 81 mg p.o. daily.  2. K-Dur.  3. Lasix 40 mg IV b.i.d.  4. Lovenox 40 mg [place] daily.  5. Vasotec 10 mg p.o. daily.   ALLERGIES:  No known drug allergies.   SOCIAL HISTORY:  The patient has no history of tobacco, alcohol, and IV  drug abuse.   FAMILY HISTORY:  Noncontributory.   REVIEW OF SYSTEMS:  Positive as above, but otherwise negative.   PHYSICAL EXAMINATION:  GENERAL:  The patient is alert and oriented x3.  VITAL SIGNS:  Blood pressure on admission; 196/98, heart rate is 88, now  102/61 with a heart rate of 88, O2 sat 92 on 2 liters of oxygen.  NECK:   Supple.  Normal JVP.  CHEST:  Lungs are clear to auscultation with decreased breath sounds at  bases.  HEART:  Regular rate and rhythm.  ABDOMEN:  Soft, nontender, nondistended.  Positive bowel sounds.  EXTREMITIES:  No edema.   STUDIES:  EKG shows sinus bradycardia at a rate of 47 beats per minute,  normal axis, and no acute ST-T segment changes.  The transthoracic  echocardiogram read by [name] reports a hyperdynamic left  ventricle with an ejection fraction of 80%, right ventricular  hypertrophy, and no significant valve abnormalities with chest pain.  X-  ray obtained on the day of admission shows pulmonary infiltrates  bilaterally.   IMPRESSION:  1. Congestive heart failure due to diastolic dysfunction.  2. Evaluate for pulmonary embolism.  3. Hypertension.  4. Dyslipidemia.   PLAN:  [name] presented with dyspnea and chest x-ray consistent  with congestive heart failure.  In addition, she had a positive D-dimer.  I suggest  to obtain a chest CT angiogram to rule out pulmonary embolism.   I agree with his current management to continue diuresis with Lasix.  The patient looks much improved and I feel she is ready to switch to  Lasix 40 mg p.o. daily.  I will decrease her Vasotec to 5 mg p.o. daily  and add Coreg 3.125 mg p.o. b.i.d.  The patient should get a repeat  transthoracic echocardiogram as an outpatient for followup.   Thank you for this interesting consultation.  Please do not hesitate to  contact me should you have any questions or concerns.  Thank you for  allowing me to assist in the care of this patient.      Meredith Mends, MD  Electronically Signed     JE/MEDQ  D:  03/31/2008  T:  04/01/2008  Job:  [id number]

## 2010-05-28 NOTE — Group Therapy Note (Signed)
NAMETRULEE, HAMSTRA NO.:  [id number]   MEDICAL RECORD NO.:  [id number]          PATIENT TYPE:  INP   LOCATION:  A319                          FACILITY:  [facility]   PHYSICIAN:  Mila Homer. Sudie Bailey, M.D.DATE OF BIRTH:  [birth year]   DATE OF PROCEDURE:  DATE OF DISCHARGE:  04/09/2008                                 PROGRESS NOTE   SUBJECTIVE:  The patient is generally feeling improved.  She is due to  be discharged yesterday, but nursing was told by another physician to  keep her.  She has been on the Coumadin 10 mg a day.   OBJECTIVE:  VITAL SIGNS:  Temperature is 98 degrees, pulse 79,  respiratory rate 16, blood pressure 145/83, and O2 sats 94% room air.  GENERAL:  She is no acute distress.  Well developed and mildly obese.  She is oriented and alert.  LUNGS:  Clear throughout.  There are no intercostal retractions.  No use  of accessory muscles of respiration.  HEART:  Regular rhythm, rate of 80.  EXTREMITIES:  There is no edema of the ankles today.   INR is 2.4.   ASSESSMENT:  1. Bilateral pulmonary emboli, much improved.  2. Congestive heart failure.   PLAN:  She is to get warfarin 10 mg today and then go home and she will  have another INR tomorrow.  She has an RX for Coumadin 5 mg, #60 and she  is not to use this today.  We will decide what she needs based on her  INR.  Again, we went over her warfarin use and the fact that it is not  only a life saving but a life threatening drug and has to be used  exactly as directed and then she will have to write down exactly how to  use it, put on her calendar immediately so she remembers, we make  changes.  Follow up in the office this week, and again, an INR tomorrow.      Mila Homer. Sudie Bailey, M.D.  Electronically Signed     SDK/MEDQ  D:  04/09/2008  T:  04/10/2008  Job:  [id number]

## 2010-05-28 NOTE — Group Therapy Note (Signed)
NAMECHANIQUE, DUCA NO.:  [id number]   MEDICAL RECORD NO.:  [id number]          PATIENT TYPE:  INP   LOCATION:  A319                          FACILITY:  [facility]   PHYSICIAN:  Mila Homer. Sudie Bailey, M.D.DATE OF BIRTH:  [birth year]   DATE OF PROCEDURE:  DATE OF DISCHARGE:                                 PROGRESS NOTE   SUBJECTIVE:  She feels a good deal better today, less short of breath.   OBJECTIVE:  She is up and around the room when I came and examined her  today.  Temperature is 98.2, pulse 79, respiratory rate 20, blood  pressure 132/75.  She is orient and alert, in no acute distress.  Well  developed, somewhat obese.  She does not seem dyspneic walking around  the room despite not having O2 on.  When I auscultated her chest, her  heart is going about 130 with this.  She has a regular rhythm without  murmur and her lungs are absolutely clear throughout moving air well.  No intercostal retraction or use of accessory muscle of respiration, and  she has minimal, if any, edema of the ankles.   White cell count is 5800, hemoglobin 11.7.  Hypercoagulable panel came  back showing homocysteine level 9.9.  The CEA of 2.8  both normal.  O2  sat today on 2 L was 98% and her venous Dopplers of the legs bilaterally  done yesterday were normal.   ASSESSMENT:  1. Pulmonary emboli.  2. Anticoagulation.  3. Question of hypercoagulable syndrome.   PLAN:  She is due to be seen by Dr. Glenford Peers, hematologist  oncologist today.  She is getting Coumadin 10 mg today and then we will  recheck daily INR starting tomorrow.  Discussed this with Pharmacy.  Meanwhile continue the Lovenox injections.      Mila Homer. Sudie Bailey, M.D.  Electronically Signed     SDK/MEDQ  D:  04/04/2008  T:  04/05/2008  Job:  [id number]

## 2010-05-28 NOTE — H&P (Signed)
NAMEMONTI, VILLERS NO.:  [id number]   MEDICAL RECORD NO.:  [id number]          PATIENT TYPE:  INP   LOCATION:  A311                          FACILITY:  [facility]   PHYSICIAN:  Mila Homer. Sudie Bailey, M.D.DATE OF BIRTH:  05-Feb-1931   DATE OF ADMISSION:  03/29/2008  DATE OF DISCHARGE:  LH                              HISTORY & PHYSICAL   This 77 year old presented to the office today extremely dyspneic.  She  had just come in from her car through the parking lot front door to our  waiting room and she was tachypneic.  She told me that this had started  about 5 or 6 weeks ago, got worse recently, and she thought it might be  secondary to injections in her neck.   She has a long and fairly complicated medical history.  She has received  injections for cervical spondylosis.  She also has benign essential  hypertension, allergic rhinitis to pollens, obesity,  hypercholesterolemia, lumbago, and reflux esophagitis.  She does have a  history of rheumatic fever in 1953.  She was seen on a regular basis in  the office.   CURRENT MEDICATIONS:  1. Cozaar 100 mg daily.  2. KCl 10 mEq daily.  3. Atenolol/chlorthalidone 50/25 mg daily.  4. Nexium 40 mg daily.  5. Simvastatin 40 mg daily.  6. Glucosamine sulfate 5 mg daily.  7. Non-aspirin pain relievers daily p.r.n.  8. She was on nabumetone 750 mg b.i.d.  9. In February of this year, I put her on taper prednisone and      traction to help her cervicalgia, but she ended up getting epidural      injections with Dr. Karin Golden on 2 occasions.  She had one other      course of prednisone 20 mg b.i.d., but only 30 pills, __________.   The patient mainly complained of dyspnea on exertion.  She really did  not have dyspnea at rest.  She noticed just stirring around she gets  short of breath.  She denied any chest pain, any pain radiating down the  arm and she had no history of having a heart attack as far as she knew.   PHYSICAL  EXAMINATION:  GENERAL:  Severely dyspneic woman who seemed  younger than her stated age.  VITAL SIGNS:  Her blood pressure is 196/98, pulse of 88, O2 sat was 98%  on room air.  SKIN:  Turgor was normal.  HEENT:  Mucous membranes are moist.  HEART:  Regular rhythm and rate of around 80 and I could not auscultate  a murmur.  LUNGS:  Clear throughout.  She seemed to be moving air well.  There are  no rhonchi or rales.  There are no intercostal retractions or use of  accessory muscles for respiration.  ABDOMEN:  Mildly obese without organomegaly, mass, or tenderness and was  1+ edema of the ankles.   Admission blood gas on room air showed a pH of 7.45, pCO2 of 35, pO2 of  56, and bicarb of 25.  Her admission white cell count is 13,600, of  which 85% were granulocytes, hemoglobin is  12.8.  The D-dimer 2.09  (normal 0-0.48) and the CMP showed a glucose mildly elevated at 102, an  SGPT of 40 and total protein of 5.9.   Her admission chest x-ray, which I discussed with the radiologist Dr.  Donavan Foil cardiomegaly and pulmonary venous hypertension with  diffuse bilateral pulmonary infiltrates mid-to-lower lungs felt to be  secondary to pulmonary edema or CHF.   ADMISSION DIAGNOSES:  1. Congestive heart failure with pulmonary edema and pedal edema.  2. History of rheumatic fever in 1953, question valvular heart      disease.  3. Hypercholesterolemia.  4. Reflux esophagitis.  5. Obesity.  6. Cervical spondylosis with cervicalgia, status post cervical steroid      injections.  7. History of lumbago.  8. Allergic rhinitis to pollen.   I talked to the patient and her family.  We admitted her to the hospital  and put on a Hep-Lock and we will give her furosemide 40 mg IV  immediately followed by q.12 h.  She will be on enalapril 10 mg daily,  and get daily weights, I's and O's, 2-g sodium diet.  I am asking  Cardiology to see her in consult.  EKG and cardiac echo are  pending.      Mila Homer. Sudie Bailey, M.D.  Electronically Signed     SDK/MEDQ  D:  03/29/2008  T:  03/30/2008  Job:  [id number]

## 2010-05-28 NOTE — Discharge Summary (Signed)
NAMEARNIE, [name] NO.:  [id number]   MEDICAL RECORD NO.:  [id number]          PATIENT TYPE:  INP   LOCATION:  A319                          FACILITY:  [facility]   PHYSICIAN:  Mila Homer. Sudie Bailey, M.D.DATE OF BIRTH:  [birth year]   DATE OF ADMISSION:  03/29/2008  DATE OF DISCHARGE:  LH                               DISCHARGE SUMMARY   This is a 77 year old woman who was admitted to the hospital for  severely dyspnea, which turned out to be secondary to a pulmonary  embolus.  She had a benign 11-day hospitalization extending from March 29, 2008, to April 08, 2008.   Admission white cell count was 13,600, of which 85% granulocytes.  Her  hemoglobin was 12.8, D-dimer 2.09, and CMP showed a glucose mildly  elevated at 102, SGPT of 40, total protein 5.9.  Blood gases showed a pH  7.45, pCO2 35, pO2 of 56, and bicarb 25.   Cardiac panel showed a CK-MB of 8.9, relative index 6.7, troponin 0.03.  Two days later; her white cell count was 7900, hemoglobin 13.4 and BMP  showed a potassium of 3.3, glucose of 119, BUN 28, creatinine 1.21, and  estimated GFR of 53.  Her BNP was less than 30.  This was checked again.  Following day, it was less than 30.  Her magnesium was 2.4.  TSH at  2.553, CEA 2.8, hypercoagulable panel essentially normal.   She had an admission chest x-ray, which was strongly suggestive for CHF  given that she had cardiomegaly, pulmonary venous hypertension, and  diffuse bilateral pulmonary infiltrates.   With the elevated D-dimer and persistent shortness of breath, however, a  CT angio chest was obtained and this did show multiple small pulmonary  emboli involving right upper lobe, right lower lobe, and left upper  lobe.  She also had a bilateral lower extremity venous duplex  ultrasound, which was negative for DVT and she had CT of the abdomen and  pelvis with contrast, which was negative for mass or adenopathy.  She  had a probable 5-cm uterine fibroid.   Further, she had screening  mammography that was likewise negative.   She was admitted to the hospital with presumptive diagnosis of CHF,  which is new onset, felt to be secondary to a silent MI or ongoing CAD.  She was put on Lovenox 40 mg subcu daily, on a monitor, furosemide 40 mg  IV, statin, and enalapril 10 mg daily.  Daily weights, Is and Os; put on  aspirin, EC-ASA 81 mg a day.  I consulted the Cardiology to see her.  She was given KCl 20 mEq daily, then b.i.d. for hypoglycemia.   When her diagnosis of pulmonary emboli was made, her Lasix was switched  to 40 mg p.o. daily.  Her enalapril was decreased to 5 mg daily and she  was put on Coreg 3.125 mg b.i.d.  Lovenox was then increased to mg/kg  subcu b.i.d.   Her INRs were followed closely and gradually, came up to 2.2-2.3 range  by the time of discharge.   She was given a warfarin education video in  the hospital.  Given more  potassium for low-potassium levels and seen by Dr. Glenford Peers,  Hematologist/Oncologist, to look for either hypercoagulable syndrome or  occult malignancy.  Work was negative at the time of discharge, although  we planned for screening colonoscopy to be done outpatient.   FINAL DISCHARGE DIAGNOSES:  1. Bilateral pulmonary emboli.  2. Congestive heart failure.  3. Cervical spondylosis and cervicalgia, status post cervical steroid      injection.  4. History of rheumatic fever in 1953.  5. Hypercholesterolemia.  6. Reflux esophagitis.  7. Obesity.  8. Seasonal allergic rhinitis.   She was discharged home on atenolol and chlorthalidone 50/25 daily,  Cozaar 100 mg daily, KCl 10 mEq daily, simvastatin 40 mg daily, Nexium  40 mg daily, and glucosamine sulfate 500 mg daily.  She is to stop her  aspirin and stop her prednisone.  She was given a dose of warfarin 10 mg  before discharge and then given her script for warfarin 5 mg #60, use 1  tomorrow and Sunday 28, and then we will get another INR 2  days from now  and decide further dosing on that.  Follow up with me in the office  either in 2 or 3 days.      Mila Homer. Sudie Bailey, M.D.  Electronically Signed     SDK/MEDQ  D:  04/08/2008  T:  04/08/2008  Job:  [id number]

## 2010-05-28 NOTE — Consult Note (Signed)
[name], [name] NO.:  [id number]   MEDICAL RECORD NO.:  [id number]          PATIENT TYPE:  INP   LOCATION:  A319                          FACILITY:  [facility]   PHYSICIAN:  Ladona Horns. Neijstrom, MD  DATE OF BIRTH:  Jul 27, 1931   DATE OF CONSULTATION:  DATE OF DISCHARGE:                                 CONSULTATION   DIAGNOSES:  1. Pulmonary emboli.  2. History of congestive heart failure.  3. History of hypertension.  4. History of midline hernia repair in the lower abdomen.  5. Hypercholesterolemia.   MEDICATION LIST:  Upon admission included;  1. Cozaar 100 mg a day.  2. KCl 10 mEq a day.  3. Atenolol/chlorthalidone 50/25 mg once a day.  4. Nexium 40 mg a day for GERD.  5. Simvastatin 40 mg a day.  6. Glucosamine sulfate 5 mg a day.  7. Nabumetone 750 mg twice a day for arthritis pain in her neck.  8. For her history of cervical disk disease status post 2 epidural      steroid injections by Dr. Karin Golden one in February and one on March 1      she states.   This is a very very pleasant lady, who is 77.  She has a history of  hypertension, obesity, hypercholesterolemia, reflux esophagitis, and  also has a history of rheumatic fever as a child in 1953.  She does not  know whether she had permanent heart damage, but nevertheless she became  more and more dyspneic over the last several days prior to admission.  She actually states that some of this dyspnea goes back to about  December 2009, but got a lot worse about 1-2 weeks prior to this  admission.   She was admitted and found to have multiple pulmonary emboli bilaterally  and no obvious reason.  Her chest x-ray showed some mild cardiomegaly  and pulmonary venous hypertension and again the pulmonary emboli.   She also, according to Dr. Michelle Nasuti note, was seen by him for history  of lower back discomfort lumbago.   Her laboratory on this admission showed that she had a pO2 of 66 on room  air, pH 7.47,  and pCO2 of 35.  She had no evidence for cardiac marker  abnormality realistically.  Her hemoglobin on admission was normal,  white count normal, platelets normal, and differential was unremarkable.  Her BMET on admission showed a BUN of 28, creatinine 1.21, and glucose  of 119.  On the 20th, she had a CMET which showed a BUN of 31 and  creatinine of 1.07.  At this time, potassium 3.4, normal calcium,  albumin 3.0, and total protein of 5.3.  Magnesium was normal on the 20th  as well.  PT on the 20th was normal at 12.8 seconds.  TSH was normal on  the same date.   She also evaluated the CEA level that was normal on the 22nd.   In talking to her, she had been married, but her husband died about 17  years ago of, sounds like, heart disease.  She has 1 son, who is in his  late 30s.  He is in good health, lives in [place].  She had no other  pregnancies.  She had no miscarriages.  She was one of the eleven  children.  There were sets of fraternal twins.  Her own fraternal sister  died around 58-63 months of age.  One of her sets of twins ,a boy and  girl, both deceased and another paired twins still living.  No one has a  history of clots, other than 1 sister who is about her age right around  53, supposedly has a history of clots and history of heart disease.   The family history is also remarkable for her father who had pulmonary  emboli, she states, 2-3 years prior to his death of heart problem at age  22.  Her mother died of a stroke at age 63.   She is not a smoker herself, she is not a drinker and she never has  been.  She has worked in a day care for a long time.   She did not breast-feed her son.  She has not had a mammogram in many  years, in fact she cannot remember when she had a last mammogram, and  she cannot remember when she had a Pap smear last and she also has never  had colonoscopy she states.   She has not lost any weight in the last 6 months.  She has good  appetite.   She is not having any fevers, chills, or night sweats.  The  breathing has much improved since she has been started on the  anticoagulation.   Her height is 5 feet 3 inches.  She is 80.6 kg.  Her temperature is  normal.  Skin is warm and dry to touch, respirations 18-20 and  unlabored, blood pressure is 130/76, and pulse right around 80 and  regular.  I did not hear a heart murmur.  I did not hear a rub or  gallop.  Her lungs were clear at this time to auscultation.  She had no  obvious lymphadenopathy.  She had no obvious thyromegaly.  She had an  upper dental plate unremarkable, normal-appearing tongue.  She had  multiple lower missing teeth.  Pupils appear to be equally round and  reactive to light.  Breast exam was negative for any obvious masses.  Her abdomen was obese without obvious organomegaly.  She does have a  midline abdominal weakness still.  The scar is well healed in the  midline below the umbilicus.  Bowel sounds were actually normal.  She  had no peripheral leg edema.  Pulses were 1 to 2+ and symmetrical.  She  is right-handed.  She is very alert and oriented.   This lady has hypercoagulable state causing pulmonary emboli, but I am  not sure what the cause of it is at this time.  I think she needs a  workup, which has already been initiated by Dr. Sudie Bailey and the only  value back thus far is a homocystine level, which is normal.   I do think she needs mammography and CT of the abdomen and pelvis to  make sure she does not have an occult malignancy and she will probably  need colonoscopy as well that is negative.   I will see her back in followup.      Ladona Horns. Mariel Sleet, MD  Electronically Signed     ESN/MEDQ  D:  04/04/2008  T:  04/05/2008  Job:  [id number]

## 2010-05-28 NOTE — Group Therapy Note (Signed)
NAMESUMI, LYE NO.:  [id number]   MEDICAL RECORD NO.:  [id number]          PATIENT TYPE:  INP   LOCATION:  A216                          FACILITY:  [facility]   PHYSICIAN:  Mila Homer. Sudie Bailey, M.D.DATE OF BIRTH:  28-Sep-1931   DATE OF PROCEDURE:  DATE OF DISCHARGE:                                 PROGRESS NOTE   SUBJECTIVE:  She feels a good deal better today.  She has got more  strength and breathing better.  She got her first dose of furosemide  last night around 6:00 p.m.   OBJECTIVE:  VITAL SIGNS:  Temperature 98.1, pulse 57, respiratory rate  20, and blood pressure 124/74.  O2 sats 98% on 2 L by nasal cannula.  GENERAL:  She is somewhat recumbent to bed.  There were sisters in  attendance.  Speech is normal.  Sentence structure is intact.  She is  well developed and well nourished.  No acute distress.  LUNGS:  Actually fairly clear throughout moving air well.  No  intercostal retraction.  No use of accessory muscles for respiration.  HEART:  Regular rhythm at rate of about 60.  EXTREMITIES:  There is still trace-to-1+ pitting edema of the ankles.   ASSESSMENT:  1. Congestive heart failure.  2. History of rheumatic fever.  3. Hypercholesterolemia.  4. Hypertension.   PLAN:  Continue with furosemide 40 mg IV q.12 h.  I am increasing her  KCl from 10-20 mg daily.  CBC and MET-7 in the morning.  Cardiac echo  was pending today and cardiologist to see her today as well.  We do know  yet the cause of her CHF, but most likely causes would be silent MI or  valvular heart disease.      Mila Homer. Sudie Bailey, M.D.  Electronically Signed     SDK/MEDQ  D:  03/30/2008  T:  03/30/2008  Job:  [id number]

## 2010-05-28 NOTE — Group Therapy Note (Signed)
[name], [name] NO.:  [id number]   MEDICAL RECORD NO.:  [id number]          PATIENT TYPE:  INP   LOCATION:  A216                          FACILITY:  [facility]   PHYSICIAN:  Mila Homer. Sudie Bailey, M.D.DATE OF BIRTH:  1931-06-20   DATE OF PROCEDURE:  DATE OF DISCHARGE:                                 PROGRESS NOTE   SUBJECTIVE:  The patient feels much better, much stronger, less short of  breath, was able to walk with her son up and down the hall yesterday.  I  talked to her length.  She had no recent travel in the last 2-3 months,  was not sitting anywhere for a long periods of time.  No surgery, but  did have the 2 injections to her neck starting in February and the  second one in March.  She did note to me that she has a sister, who had  a heart attack, and also developed blood clots while she was in the  hospital with heart attack and father had blood clots.   OBJECTIVE:  VITAL SIGNS:  The patient's temperature is 98.5, pulse 74,  respiratory rate 20, blood pressure 111/72, and O2 sats 99% on 2 L by  nasal cannula.  GENERAL:  She is supine in bed.  No acute distress.  Well developed and  somewhat obese.  LUNGS:  Clear throughout without any intercostal retraction without use  of accessory muscles of respiration.  HEART:  Regular rhythm without murmur, rate of 70.  ABDOMEN:  Soft without organomegaly or mass or tenderness.  There is no  edema of the ankles.   Today's white cell count is 5500, hemoglobin 11.0, INR 1.1.  MET 7 shows  a glucose of 106.   After consulting with Cardiology 3 days ago, we went ahead and got the  CT of the lung with PE protocol.  It turned out, she did have pulmonary  emboli for which she is currently on Lovenox and just started on  Coumadin.   ASSESSMENT:  1. Pulmonary emboli.  2. Question mild congestive heart failure.  3. Question hypercoagulable syndrome.   PLAN:  We will get a hypercoagulable profile today.  I talked to  Dr.  Jacinto Halim, the cardiologist from [facility], about her  today and Cardiology will probably sign off after today, since it does  not seem to be a primary heart problem.  We will have Dr. Mariel Sleet, to  see her in consult concerning a possible hypercoagulable syndrome.  Meanwhile, continue the Lovenox and with the Coumadin to be followed by  pharmacy.  I discussed Coumadin at length with her today and the  importance of having just the right level of Coumadin to prevent her  from having further clots and to make sure this clot cools down.     Mila Homer. Sudie Bailey, M.D.  Electronically Signed    SDK/MEDQ  D:  04/03/2008  T:  04/04/2008  Job:  [id number]

## 2010-05-28 NOTE — Group Therapy Note (Signed)
NAMESHEILLA, [name] NO.:  [id number]   MEDICAL RECORD NO.:  [id number]         PATIENT TYPE:  PINP   LOCATION:  A319                          FACILITY:  [facility]   PHYSICIAN:  Mila Homer. Sudie Bailey, M.D.DATE OF BIRTH:  [birth year]   DATE OF PROCEDURE:  DATE OF DISCHARGE:                                 PROGRESS NOTE   SUBJECTIVE:  Generally, she is feeling better and still short of breath  with activity, but much improved compared to her admission.  She is  complaining of knots on her abdomen from the Lovenox injections.   OBJECTIVE:  GENERAL:  She is somewhat recumbent in bed.  No acute  distress.  Well-developed, somewhat obese, and looks totally normal.  VITAL SIGNS:  Temperature is 99.0, pulse 85, respiratory rate 20, blood  pressure 119/73.  O2 sats 92% on room air.  INR 2.2.  LUNGS:  Clear throughout moving air well.  There are no intercostal  retractions.  There is no use of the accessory muscles or respiration.  HEART:  Regular rhythm and rate of about 80.  ABDOMEN:  There are two areas of hematoma, one on the right lower  abdomen, the other on left lower abdomen associated with her Lovenox  injections.   ASSESSMENT:  1. Pulmonary emboli.  2. Anticoagulation.  3. Subcutaneous hematomas of the abdominal wall secondary to Lovenox      injections.   PLAN:  Coumadin 5 mg tonight.  She should be able to go home tomorrow as  long as her INR is roughly 2.5.  We will be able to stop Lovenox at that  time.  Discussed with Dr. Glenford Peers, her oncologist, at length in  the phone earlier today and the workup for her is negative at this point  including a negative mammography done 2 days ago.  She will, however,  need a colonoscopy outpatient and again I discussed this with her.  I  also discussed the discharge planning, undergoing double check on her O2  and see if she needs home O2.   We will arrange for her home health to see her.  Again, she will be able  to be discharged tomorrow if she is stable and continues to improve.      Mila Homer. Sudie Bailey, M.D.  Electronically Signed     SDK/MEDQ  D:  04/07/2008  T:  04/08/2008  Job:  [id number]

## 2010-10-11 LAB — URINALYSIS, ROUTINE W REFLEX MICROSCOPIC
Bilirubin Urine: NEGATIVE
Glucose, UA: NEGATIVE
Hgb urine dipstick: NEGATIVE
Specific Gravity, Urine: >1.03 — ABNORMAL HIGH
Urobilinogen, UA: 0.2

## 2010-10-14 ENCOUNTER — Other Ambulatory Visit: Payer: Self-pay | Admitting: *Deleted

## 2010-10-14 MED ORDER — AMLODIPINE BESYLATE 5 MG PO TABS: 5.0000 mg | ORAL_TABLET | Freq: Every day | ORAL | Status: DC

## 2011-01-13 ENCOUNTER — Encounter: Payer: Self-pay | Admitting: Cardiology

## 2011-06-20 ENCOUNTER — Other Ambulatory Visit (HOSPITAL_COMMUNITY): Payer: Self-pay | Admitting: Family Medicine

## 2011-06-20 DIAGNOSIS — Z139 Encounter for screening, unspecified: Secondary | ICD-10-CM

## 2011-06-23 ENCOUNTER — Ambulatory Visit (HOSPITAL_COMMUNITY)
Admission: RE | Admit: 2011-06-23 | Discharge: 2011-06-23 | Disposition: A | Discharge status: Home / Self Care | Payer: Medicare Other | Source: Ambulatory Visit | Attending: Family Medicine | Admitting: Family Medicine

## 2011-06-23 DIAGNOSIS — Z1231 Encounter for screening mammogram for malignant neoplasm of breast: Secondary | ICD-10-CM | POA: Insufficient documentation

## 2011-06-23 DIAGNOSIS — Z139 Encounter for screening, unspecified: Secondary | ICD-10-CM

## 2013-03-28 ENCOUNTER — Other Ambulatory Visit (HOSPITAL_COMMUNITY): Payer: Self-pay | Admitting: Family Medicine

## 2013-03-28 ENCOUNTER — Ambulatory Visit (HOSPITAL_COMMUNITY)
Admission: RE | Admit: 2013-03-28 | Discharge: 2013-03-28 | Disposition: A | Discharge status: Home / Self Care | Payer: Medicare Other | Source: Ambulatory Visit | Attending: Family Medicine | Admitting: Family Medicine

## 2013-03-28 DIAGNOSIS — J90 Pleural effusion, not elsewhere classified: Secondary | ICD-10-CM | POA: Insufficient documentation

## 2013-03-28 DIAGNOSIS — R059 Cough, unspecified: Secondary | ICD-10-CM | POA: Insufficient documentation

## 2013-03-28 DIAGNOSIS — R05 Cough: Secondary | ICD-10-CM

## 2013-03-28 DIAGNOSIS — R0602 Shortness of breath: Secondary | ICD-10-CM | POA: Insufficient documentation

## 2013-07-05 ENCOUNTER — Other Ambulatory Visit (HOSPITAL_COMMUNITY): Payer: Self-pay | Admitting: Family Medicine

## 2013-07-05 ENCOUNTER — Ambulatory Visit (HOSPITAL_COMMUNITY)
Admission: RE | Admit: 2013-07-05 | Discharge: 2013-07-05 | Disposition: A | Discharge status: Home / Self Care | Payer: Medicare Other | Source: Ambulatory Visit | Attending: Family Medicine | Admitting: Family Medicine

## 2013-07-05 DIAGNOSIS — M1710 Unilateral primary osteoarthritis, unspecified knee: Secondary | ICD-10-CM | POA: Diagnosis not present

## 2013-07-05 DIAGNOSIS — IMO0002 Reserved for concepts with insufficient information to code with codable children: Secondary | ICD-10-CM | POA: Insufficient documentation

## 2013-07-05 DIAGNOSIS — M25469 Effusion, unspecified knee: Secondary | ICD-10-CM | POA: Diagnosis not present

## 2013-07-05 DIAGNOSIS — M25561 Pain in right knee: Secondary | ICD-10-CM

## 2013-07-05 DIAGNOSIS — M25569 Pain in unspecified knee: Secondary | ICD-10-CM | POA: Diagnosis present

## 2013-11-01 ENCOUNTER — Other Ambulatory Visit (HOSPITAL_COMMUNITY): Payer: Self-pay | Admitting: Family Medicine

## 2013-11-01 ENCOUNTER — Ambulatory Visit (HOSPITAL_COMMUNITY)
Admission: RE | Admit: 2013-11-01 | Discharge: 2013-11-01 | Disposition: A | Discharge status: Home / Self Care | Payer: Medicare Other | Source: Ambulatory Visit | Attending: Family Medicine | Admitting: Family Medicine

## 2013-11-01 DIAGNOSIS — M25511 Pain in right shoulder: Secondary | ICD-10-CM

## 2014-05-15 ENCOUNTER — Other Ambulatory Visit (HOSPITAL_COMMUNITY): Payer: Self-pay | Admitting: Family Medicine

## 2014-05-15 DIAGNOSIS — M542 Cervicalgia: Secondary | ICD-10-CM

## 2014-05-22 ENCOUNTER — Ambulatory Visit (HOSPITAL_COMMUNITY)
Admission: RE | Admit: 2014-05-22 | Discharge: 2014-05-22 | Disposition: A | Discharge status: Home / Self Care | Payer: Medicare Other | Source: Ambulatory Visit | Attending: Family Medicine | Admitting: Family Medicine

## 2014-05-22 DIAGNOSIS — M542 Cervicalgia: Secondary | ICD-10-CM | POA: Diagnosis not present

## 2014-06-02 ENCOUNTER — Other Ambulatory Visit: Payer: Self-pay | Admitting: Family Medicine

## 2014-06-02 DIAGNOSIS — M5412 Radiculopathy, cervical region: Secondary | ICD-10-CM

## 2014-06-02 DIAGNOSIS — M542 Cervicalgia: Secondary | ICD-10-CM

## 2014-06-06 ENCOUNTER — Ambulatory Visit
Admission: RE | Admit: 2014-06-06 | Discharge: 2014-06-06 | Disposition: A | Discharge status: Home / Self Care | Payer: Medicare Other | Source: Ambulatory Visit | Attending: Family Medicine | Admitting: Family Medicine

## 2014-06-06 DIAGNOSIS — M542 Cervicalgia: Secondary | ICD-10-CM

## 2014-06-06 DIAGNOSIS — M5412 Radiculopathy, cervical region: Secondary | ICD-10-CM

## 2014-06-06 MED ORDER — TRIAMCINOLONE ACETONIDE 40 MG/ML IJ SUSP (RADIOLOGY)
60.0000 mg | Freq: Once | INTRAMUSCULAR | Status: AC
  Administered 2014-06-06: 60 mg via EPIDURAL

## 2014-06-06 MED ORDER — IOHEXOL 300 MG/ML  SOLN
1.0000 mL | Freq: Once | INTRAMUSCULAR | Status: AC | PRN
  Administered 2014-06-06: 1 mL via EPIDURAL

## 2014-06-06 NOTE — Discharge Instructions (Signed)

## 2014-11-29 ENCOUNTER — Other Ambulatory Visit (HOSPITAL_COMMUNITY): Payer: Self-pay | Admitting: Family Medicine

## 2014-11-29 ENCOUNTER — Ambulatory Visit (HOSPITAL_COMMUNITY)
Admission: RE | Admit: 2014-11-29 | Discharge: 2014-11-29 | Disposition: A | Discharge status: Home / Self Care | Payer: Medicare Other | Source: Ambulatory Visit | Attending: Family Medicine | Admitting: Family Medicine

## 2014-11-29 DIAGNOSIS — M79671 Pain in right foot: Secondary | ICD-10-CM

## 2015-05-25 ENCOUNTER — Other Ambulatory Visit (HOSPITAL_COMMUNITY): Payer: Self-pay | Admitting: Family Medicine

## 2015-05-25 ENCOUNTER — Ambulatory Visit (HOSPITAL_COMMUNITY)
Admission: RE | Admit: 2015-05-25 | Discharge: 2015-05-25 | Disposition: A | Discharge status: Home / Self Care | Payer: Medicare Other | Source: Ambulatory Visit | Attending: Family Medicine | Admitting: Family Medicine

## 2015-05-25 DIAGNOSIS — R05 Cough: Secondary | ICD-10-CM | POA: Diagnosis present

## 2015-05-25 DIAGNOSIS — I509 Heart failure, unspecified: Secondary | ICD-10-CM | POA: Diagnosis not present

## 2015-05-25 DIAGNOSIS — R053 Chronic cough: Secondary | ICD-10-CM

## 2015-10-02 ENCOUNTER — Other Ambulatory Visit: Payer: Self-pay | Admitting: Neurology

## 2015-10-05 ENCOUNTER — Other Ambulatory Visit: Payer: Self-pay | Admitting: Neurology

## 2015-10-05 DIAGNOSIS — I671 Cerebral aneurysm, nonruptured: Secondary | ICD-10-CM

## 2015-10-10 ENCOUNTER — Ambulatory Visit (HOSPITAL_COMMUNITY)
Admission: RE | Admit: 2015-10-10 | Discharge: 2015-10-10 | Disposition: A | Discharge status: Home / Self Care | Payer: Medicare Other | Source: Ambulatory Visit | Attending: Neurology | Admitting: Neurology

## 2015-10-10 ENCOUNTER — Other Ambulatory Visit: Payer: Self-pay | Admitting: Neurology

## 2015-10-10 DIAGNOSIS — I6782 Cerebral ischemia: Secondary | ICD-10-CM | POA: Diagnosis not present

## 2015-10-10 DIAGNOSIS — Z8673 Personal history of transient ischemic attack (TIA), and cerebral infarction without residual deficits: Secondary | ICD-10-CM | POA: Insufficient documentation

## 2015-10-10 DIAGNOSIS — I671 Cerebral aneurysm, nonruptured: Secondary | ICD-10-CM | POA: Diagnosis present

## 2016-09-30 ENCOUNTER — Ambulatory Visit (HOSPITAL_COMMUNITY)
Admission: RE | Admit: 2016-09-30 | Discharge: 2016-09-30 | Disposition: A | Discharge status: Home / Self Care | Payer: Medicare Other | Source: Ambulatory Visit | Attending: Family Medicine | Admitting: Family Medicine

## 2016-09-30 ENCOUNTER — Other Ambulatory Visit (HOSPITAL_COMMUNITY): Payer: Self-pay | Admitting: Family Medicine

## 2016-09-30 DIAGNOSIS — M25551 Pain in right hip: Secondary | ICD-10-CM

## 2016-10-21 ENCOUNTER — Other Ambulatory Visit (HOSPITAL_COMMUNITY): Payer: Self-pay | Admitting: Family Medicine

## 2016-10-21 ENCOUNTER — Ambulatory Visit (HOSPITAL_COMMUNITY)
Admission: RE | Admit: 2016-10-21 | Discharge: 2016-10-21 | Disposition: A | Discharge status: Home / Self Care | Payer: Medicare Other | Source: Ambulatory Visit | Attending: Family Medicine | Admitting: Family Medicine

## 2016-10-21 DIAGNOSIS — M545 Low back pain: Secondary | ICD-10-CM

## 2016-10-21 DIAGNOSIS — M5136 Other intervertebral disc degeneration, lumbar region: Secondary | ICD-10-CM | POA: Diagnosis not present

## 2016-10-21 DIAGNOSIS — M47816 Spondylosis without myelopathy or radiculopathy, lumbar region: Secondary | ICD-10-CM | POA: Insufficient documentation

## 2017-05-20 ENCOUNTER — Other Ambulatory Visit (HOSPITAL_COMMUNITY): Payer: Self-pay | Admitting: Family Medicine

## 2017-05-20 DIAGNOSIS — Z1231 Encounter for screening mammogram for malignant neoplasm of breast: Secondary | ICD-10-CM

## 2017-05-28 ENCOUNTER — Encounter (HOSPITAL_COMMUNITY): Payer: Self-pay

## 2017-05-28 ENCOUNTER — Other Ambulatory Visit (HOSPITAL_COMMUNITY): Payer: Self-pay | Admitting: Nurse Practitioner

## 2017-05-28 ENCOUNTER — Ambulatory Visit (HOSPITAL_COMMUNITY)
Admission: RE | Admit: 2017-05-28 | Discharge: 2017-05-28 | Disposition: A | Discharge status: Home / Self Care | Payer: Medicare Other | Source: Ambulatory Visit | Attending: Nurse Practitioner | Admitting: Nurse Practitioner

## 2017-05-28 ENCOUNTER — Ambulatory Visit (HOSPITAL_COMMUNITY)
Admission: RE | Admit: 2017-05-28 | Discharge: 2017-05-28 | Disposition: A | Discharge status: Home / Self Care | Payer: Medicare Other | Source: Ambulatory Visit | Attending: Family Medicine | Admitting: Family Medicine

## 2017-05-28 DIAGNOSIS — M25562 Pain in left knee: Secondary | ICD-10-CM

## 2017-05-28 DIAGNOSIS — Z1231 Encounter for screening mammogram for malignant neoplasm of breast: Secondary | ICD-10-CM | POA: Diagnosis not present

## 2017-05-28 DIAGNOSIS — M25462 Effusion, left knee: Secondary | ICD-10-CM | POA: Diagnosis not present

## 2017-05-28 DIAGNOSIS — M1712 Unilateral primary osteoarthritis, left knee: Secondary | ICD-10-CM | POA: Insufficient documentation

## 2017-09-01 ENCOUNTER — Ambulatory Visit (HOSPITAL_COMMUNITY)
Admission: RE | Admit: 2017-09-01 | Discharge: 2017-09-01 | Disposition: A | Discharge status: Home / Self Care | Payer: Medicare Other | Source: Ambulatory Visit | Attending: Nurse Practitioner | Admitting: Nurse Practitioner

## 2017-09-01 ENCOUNTER — Other Ambulatory Visit (HOSPITAL_COMMUNITY): Payer: Self-pay | Admitting: Nurse Practitioner

## 2017-09-01 DIAGNOSIS — M5134 Other intervertebral disc degeneration, thoracic region: Secondary | ICD-10-CM | POA: Diagnosis not present

## 2017-09-01 DIAGNOSIS — M25551 Pain in right hip: Secondary | ICD-10-CM | POA: Diagnosis present

## 2017-09-01 DIAGNOSIS — M549 Dorsalgia, unspecified: Secondary | ICD-10-CM

## 2017-09-01 DIAGNOSIS — M545 Low back pain: Secondary | ICD-10-CM

## 2017-09-01 DIAGNOSIS — M4316 Spondylolisthesis, lumbar region: Secondary | ICD-10-CM | POA: Insufficient documentation

## 2017-09-01 DIAGNOSIS — M4185 Other forms of scoliosis, thoracolumbar region: Secondary | ICD-10-CM | POA: Diagnosis not present

## 2017-09-01 DIAGNOSIS — M47896 Other spondylosis, lumbar region: Secondary | ICD-10-CM | POA: Insufficient documentation

## 2017-09-15 ENCOUNTER — Other Ambulatory Visit (HOSPITAL_COMMUNITY): Payer: Self-pay | Admitting: Nurse Practitioner

## 2017-09-15 DIAGNOSIS — M5441 Lumbago with sciatica, right side: Secondary | ICD-10-CM

## 2017-09-17 ENCOUNTER — Ambulatory Visit (HOSPITAL_COMMUNITY)
Admission: RE | Admit: 2017-09-17 | Discharge: 2017-09-17 | Disposition: A | Discharge status: Home / Self Care | Payer: Medicare Other | Source: Ambulatory Visit | Attending: Nurse Practitioner | Admitting: Nurse Practitioner

## 2017-09-17 DIAGNOSIS — M47816 Spondylosis without myelopathy or radiculopathy, lumbar region: Secondary | ICD-10-CM | POA: Insufficient documentation

## 2017-09-17 DIAGNOSIS — M5441 Lumbago with sciatica, right side: Secondary | ICD-10-CM | POA: Diagnosis not present

## 2017-09-24 ENCOUNTER — Other Ambulatory Visit: Payer: Self-pay | Admitting: Nurse Practitioner

## 2017-09-24 DIAGNOSIS — M5441 Lumbago with sciatica, right side: Secondary | ICD-10-CM

## 2017-10-08 ENCOUNTER — Ambulatory Visit
Admission: RE | Admit: 2017-10-08 | Discharge: 2017-10-08 | Disposition: A | Discharge status: Home / Self Care | Payer: Medicare Other | Source: Ambulatory Visit | Attending: Nurse Practitioner | Admitting: Nurse Practitioner

## 2017-10-08 DIAGNOSIS — M5441 Lumbago with sciatica, right side: Secondary | ICD-10-CM

## 2017-10-08 MED ORDER — METHYLPREDNISOLONE ACETATE 40 MG/ML INJ SUSP (RADIOLOG
120.0000 mg | Freq: Once | INTRAMUSCULAR | Status: AC
  Administered 2017-10-08: 120 mg via EPIDURAL

## 2017-10-08 MED ORDER — IOPAMIDOL (ISOVUE-M 200) INJECTION 41%
1.0000 mL | Freq: Once | INTRAMUSCULAR | Status: AC
  Administered 2017-10-08: 1 mL via EPIDURAL

## 2017-10-08 NOTE — Discharge Instructions (Signed)

## 2017-10-30 ENCOUNTER — Emergency Department (HOSPITAL_COMMUNITY): Payer: Medicare Other

## 2017-10-30 ENCOUNTER — Emergency Department (HOSPITAL_COMMUNITY)
Admission: EM | Admit: 2017-10-30 | Discharge: 2017-10-31 | Disposition: A | Discharge status: Home / Self Care | Payer: Medicare Other | Attending: Emergency Medicine | Admitting: Emergency Medicine

## 2017-10-30 ENCOUNTER — Other Ambulatory Visit: Payer: Self-pay

## 2017-10-30 ENCOUNTER — Encounter (HOSPITAL_COMMUNITY): Payer: Self-pay

## 2017-10-30 DIAGNOSIS — R51 Headache: Secondary | ICD-10-CM | POA: Diagnosis present

## 2017-10-30 DIAGNOSIS — I110 Hypertensive heart disease with heart failure: Secondary | ICD-10-CM | POA: Diagnosis not present

## 2017-10-30 DIAGNOSIS — I509 Heart failure, unspecified: Secondary | ICD-10-CM | POA: Diagnosis not present

## 2017-10-30 DIAGNOSIS — I10 Essential (primary) hypertension: Secondary | ICD-10-CM

## 2017-10-30 DIAGNOSIS — Z79899 Other long term (current) drug therapy: Secondary | ICD-10-CM | POA: Insufficient documentation

## 2017-10-30 DIAGNOSIS — R519 Headache, unspecified: Secondary | ICD-10-CM

## 2017-10-30 LAB — CBC WITH DIFFERENTIAL/PLATELET
ABS IMMATURE GRANULOCYTES: 0.01 10*3/uL (ref 0.00–0.07)
BASOS ABS: 0 10*3/uL (ref 0.0–0.1)
Basophils Relative: 1 %
EOS PCT: 1 %
Eosinophils Absolute: 0.1 10*3/uL (ref 0.0–0.5)
HCT: 40.6 % (ref 36.0–46.0)
HEMOGLOBIN: 12.3 g/dL (ref 12.0–15.0)
IMMATURE GRANULOCYTES: 0 %
LYMPHS PCT: 25 %
Lymphs Abs: 1.6 10*3/uL (ref 0.7–4.0)
MCH: 25.5 pg — ABNORMAL LOW (ref 26.0–34.0)
MCHC: 30.3 g/dL (ref 30.0–36.0)
MCV: 84.2 fL (ref 80.0–100.0)
MONO ABS: 0.4 10*3/uL (ref 0.1–1.0)
Monocytes Relative: 7 %
NRBC: 0 % (ref 0.0–0.2)
Neutro Abs: 4.1 10*3/uL (ref 1.7–7.7)
Neutrophils Relative %: 66 %
Platelets: 232 10*3/uL (ref 150–400)
RBC: 4.82 MIL/uL (ref 3.87–5.11)
RDW: 16.9 % — ABNORMAL HIGH (ref 11.5–15.5)
WBC: 6.2 10*3/uL (ref 4.0–10.5)

## 2017-10-30 LAB — BASIC METABOLIC PANEL
Anion gap: 10 (ref 5–15)
BUN: 19 mg/dL (ref 8–23)
CALCIUM: 9.8 mg/dL (ref 8.9–10.3)
CHLORIDE: 99 mmol/L (ref 98–111)
CO2: 26 mmol/L (ref 22–32)
CREATININE: 0.94 mg/dL (ref 0.44–1.00)
GFR calc Af Amer: >60 mL/min (ref >60)
GFR, EST NON AFRICAN AMERICAN: 54 mL/min — AB (ref >60)
Glucose, Bld: 85 mg/dL (ref 70–99)
Potassium: 3.4 mmol/L — ABNORMAL LOW (ref 3.5–5.1)
SODIUM: 135 mmol/L (ref 135–145)

## 2017-10-30 MED ORDER — ACETAMINOPHEN 325 MG PO TABS
650.0000 mg | ORAL_TABLET | Freq: Once | ORAL | Status: AC
  Administered 2017-10-30: 650 mg via ORAL
  Filled 2017-10-30: qty 2

## 2017-10-30 MED ORDER — ONDANSETRON 4 MG PO TBDP
4.0000 mg | ORAL_TABLET | Freq: Once | ORAL | Status: AC
  Administered 2017-10-30: 4 mg via ORAL
  Filled 2017-10-30: qty 1

## 2017-10-30 NOTE — ED Triage Notes (Signed)
Pt was seen by PCP earlier today for elevated BP and headache. PCP told Pt headache was related to elevated BP. Pt denies taking any medications for headache but did take her BP medx this morning, but not this evening. Pt called [facility] to come to ED tonight for persistent headache.

## 2017-10-30 NOTE — ED Provider Notes (Signed)
[facility] EMERGENCY DEPARTMENT Provider Note   CSN: [id number] Arrival date & time: 10/30/17  2026     History   Chief Complaint Chief Complaint  Patient presents with  . Headache    HPI [name] is a 84 y.o. female.  HPI   [name] is a 84 y.o. female who presents to the Emergency Department complaining of left-sided and frontal headache since waking this morning.  She also reports that her blood pressure has been elevated today.  She was seen earlier today by her PCP and advised that her headache was related to her elevated blood pressure.  She states that she has taking her daily blood pressure medications this morning.  After she returned home she states that her headache has become worse throughout the day.  She has had one episode of vomiting upon arrival to the ER.  She denies head injury, pain numbness or weakness of her extremities, visual changes, dizziness, recent fever or chills.  No neck pain or stiffness.  No chest pain or shortness or breath   Past Medical History:  Diagnosis Date  . Allergic rhinitis   . CHF (congestive heart failure) (HCC)   . DJD (degenerative joint disease) of cervical spine    and lumbosacral  . GERD (gastroesophageal reflux disease)   . Hyperlipidemia   . Hypertension   . Obesity   . Pulmonary embolism (HCC)   . Rheumatic fever     Patient Active Problem List   Diagnosis Date Noted  . PULMONARY EMBOLISM 08/07/2009  . HYPERLIPIDEMIA 07/05/2009  . OBESITY 07/05/2009  . GASTROESOPHAGEAL REFLUX DISEASE 07/05/2009  . DYSPNEA 07/05/2009  . IMPINGEMENT SYNDROME 06/21/2009  . SHOULDER PAIN 01/18/2009    Past Surgical History:  Procedure Laterality Date  . HERNIA REPAIR     Possibly umbilical     OB History   None      Home Medications    Prior to Admission medications   Medication Sig Start Date End Date Taking? Authorizing Provider  acetaminophen (TYLENOL) 500 MG tablet Take 500 mg by mouth  daily.      [provider]  amLODipine (NORVASC) 5 MG tablet Take 1 tablet (5 mg total) by mouth daily. 10/14/10   Kathlen Brunswick, MD  atenolol-chlorthalidone (TENORETIC) 50-25 MG per tablet Take 1 tablet by mouth daily.      [provider]  esomeprazole (NEXIUM) 40 MG capsule Take 40 mg by mouth daily before breakfast.      [provider]  HYDROcodone-acetaminophen (NORCO) 5-325 MG per tablet Take 1 tablet by mouth every 4 (four) hours as needed.      [provider]  losartan (COZAAR) 100 MG tablet Take 200 mg by mouth 2 (two) times daily.      [provider]  naproxen (NAPROSYN) 500 MG tablet Take 500 mg by mouth 2 (two) times daily with a meal.      [provider]  potassium chloride (K-DUR,KLOR-CON) 10 MEQ tablet Take 20 mEq by mouth 2 (two) times daily.      [provider]  pravastatin (PRAVACHOL) 40 MG tablet Take 40 mg by mouth at bedtime.      [provider]  spironolactone (ALDACTONE) 25 MG tablet Take 25 mg by mouth daily.      [provider]  torsemide (DEMADEX) 100 MG tablet Take 50 mg by mouth daily.      [provider]    Family History Family  History  Problem Relation Age of Onset  . Stroke Mother   . Stroke Father   . Heart attack Brother   . Cirrhosis Brother   . Heart attack Sister     Social History Social History   Tobacco Use  . Smoking status: Never Smoker  . Smokeless tobacco: Never Used  Substance Use Topics  . Alcohol use: No  . Drug use: Never     Allergies   Patient has no known allergies.   Review of Systems Review of Systems  Constitutional: Negative for activity change, appetite change and fever.  HENT: Negative for facial swelling and trouble swallowing.   Eyes: Positive for photophobia. Negative for pain and visual disturbance.  Respiratory: Negative for shortness of breath.   Cardiovascular: Negative for chest pain.  Gastrointestinal:  Negative for nausea and vomiting.  Musculoskeletal: Negative for neck pain and neck stiffness.  Skin: Negative for rash and wound.  Neurological: Positive for headaches. Negative for dizziness, facial asymmetry, speech difficulty, weakness and numbness.  Psychiatric/Behavioral: Negative for confusion and decreased concentration.     Physical Exam Updated Vital Signs BP (!) 176/91 (BP Location: Left Arm)   Pulse 72   Temp 100.3 F (37.9 C) (Oral)   Resp 18   Ht 5\' 3"  (1.6 m)   Wt 73.9 kg   SpO2 94%   BMI 28.87 kg/m   Physical Exam  Constitutional: She is oriented to person, place, and time. She appears well-developed and well-nourished. No distress.  HENT:  Head: Normocephalic and atraumatic.  Mouth/Throat: Oropharynx is clear and moist.  Eyes: Pupils are equal, round, and reactive to light. Conjunctivae and EOM are normal.  Neck: Normal range of motion and phonation normal. Neck supple. No JVD present. No spinous process tenderness and no muscular tenderness present. No neck rigidity. No tracheal deviation present. No Kernig's sign noted.  Cardiovascular: Normal rate, regular rhythm and intact distal pulses.  Pulmonary/Chest: Effort normal and breath sounds normal. No respiratory distress.  Abdominal: Soft. She exhibits no distension. There is no tenderness. There is no guarding.  Musculoskeletal: Normal range of motion. She exhibits no edema.  Neurological: She is alert and oriented to person, place, and time. She has normal strength. No cranial nerve deficit or sensory deficit. She exhibits normal muscle tone. Coordination and gait normal. GCS eye subscore is 4. GCS verbal subscore is 5. GCS motor subscore is 6.  CN III-XII grossly intact.  Speech clear.  Follows commands appropriately.    No pronator drift.  Normal finger-nose testing  Skin: Skin is warm and dry. Capillary refill takes less than 2 seconds. No rash noted.  Psychiatric: She has a normal mood and affect. Thought  content normal.  Nursing note and vitals reviewed.    ED Treatments / Results  Labs (all labs ordered are listed, but only abnormal results are displayed) Labs Reviewed  BASIC METABOLIC PANEL - Abnormal; Notable for the following components:      Result Value   Potassium 3.4 (*)    GFR calc non Af Amer 54 (*)    All other components within normal limits  CBC WITH DIFFERENTIAL/PLATELET - Abnormal; Notable for the following components:   MCH 25.5 (*)    RDW 16.9 (*)    All other components within normal limits  SEDIMENTATION RATE    EKG None  Radiology Ct Head Wo Contrast  Result Date: 10/30/2017 CLINICAL DATA:  Headache, acute, normal neuro exam EXAM: CT HEAD WITHOUT CONTRAST TECHNIQUE: Contiguous axial  images were obtained from the base of the skull through the vertex without intravenous contrast. COMPARISON:  Brain MRI 10/09/2017 FINDINGS: Brain: Age related atrophy. Mild chronic small vessel ischemia. Remote lacunar infarct in left basal ganglia. No intracranial hemorrhage, mass effect, or midline shift. No hydrocephalus. The basilar cisterns are patent. No evidence of territorial infarct or acute ischemia. No extra-axial or intracranial fluid collection. Vascular: Atherosclerosis of skullbase vasculature without hyperdense vessel or abnormal calcification. Skull: No fracture or focal lesion. Sinuses/Orbits: Paranasal sinuses and mastoid air cells are clear. The visualized orbits are unremarkable. Other: None. IMPRESSION: 1.  No acute intracranial abnormality. 2. Mild chronic small vessel ischemia and remote lacunar infarct in left basal ganglia. Electronically Signed   By: Narda Rutherford M.D.   On: 10/30/2017 23:09    Procedures Procedures (including critical care time)  Medications Ordered in ED Medications  acetaminophen (TYLENOL) tablet 650 mg (has no administration in time range)  ondansetron (ZOFRAN-ODT) disintegrating tablet 4 mg (has no administration in time range)       Initial Impression / Assessment and Plan / ED Course  I have reviewed the triage vital signs and the nursing notes.  Pertinent labs & imaging results that were available during my care of the patient were reviewed by me and considered in my medical decision making (see chart for details).  Clinical Course as of Oct 31 32  Fri Oct 30, 2017  6140 84 year-old female here with headache after seeing her doctor's office today for elevated blood pressure and headache.  She had some screening blood work was unremarkable and a head CT which showed an old stroke but nothing acute.  She still pending a sed rate and I think this is unremarkable she likely can be discharged to follow-up with her PCP.   [MB]    Clinical Course User Index [MB] Terrilee Files, MD   [phone]  On recheck pt reports feeling better and headache has resolved and she is requesting d/c home.  BP also improved.  Neuro exam, labs and CT scan head reassuring She appears appropriate for d/c home.  Agrees to continue anti-hypertensive meds.  PCP f/u if needed.  Return precautions discussed.     Final Clinical Impressions(s) / ED Diagnoses   Final diagnoses:  Headache disorder  Essential hypertension    ED Discharge Orders    None       Pauline Aus, PA-C 10/31/17 0048    Terrilee Files, MD 10/31/17 1315

## 2017-10-31 LAB — SEDIMENTATION RATE: SED RATE: 2 mm/h (ref 0–22)

## 2017-10-31 NOTE — Discharge Instructions (Addendum)
Continue taking your blood pressure medication each day as directed.  Follow-up with your primary doctor for recheck return to the ER for any worsening symptoms.

## 2018-03-19 IMAGING — DX DG LUMBAR SPINE COMPLETE 4+V
5 series · 5 of 5 positions shown · non-contrast
Comparison: None.

CLINICAL DATA: Right-sided low back pain for 3 days. No known
injury.

EXAM:
LUMBAR SPINE - COMPLETE 4+ VIEW

[l-spine ap]
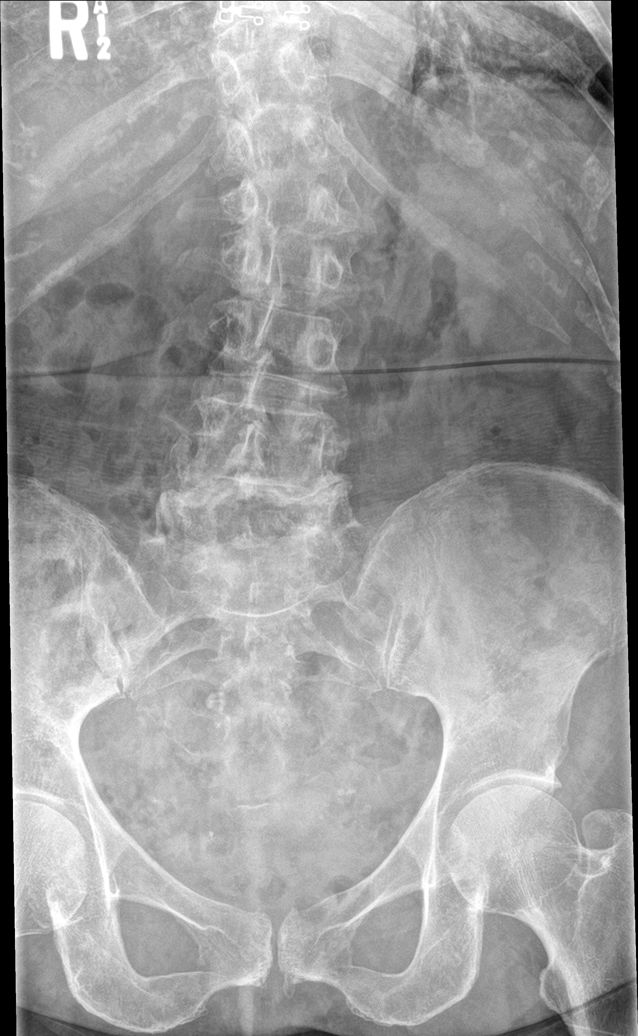

[l-spine obl (1 of 2)]
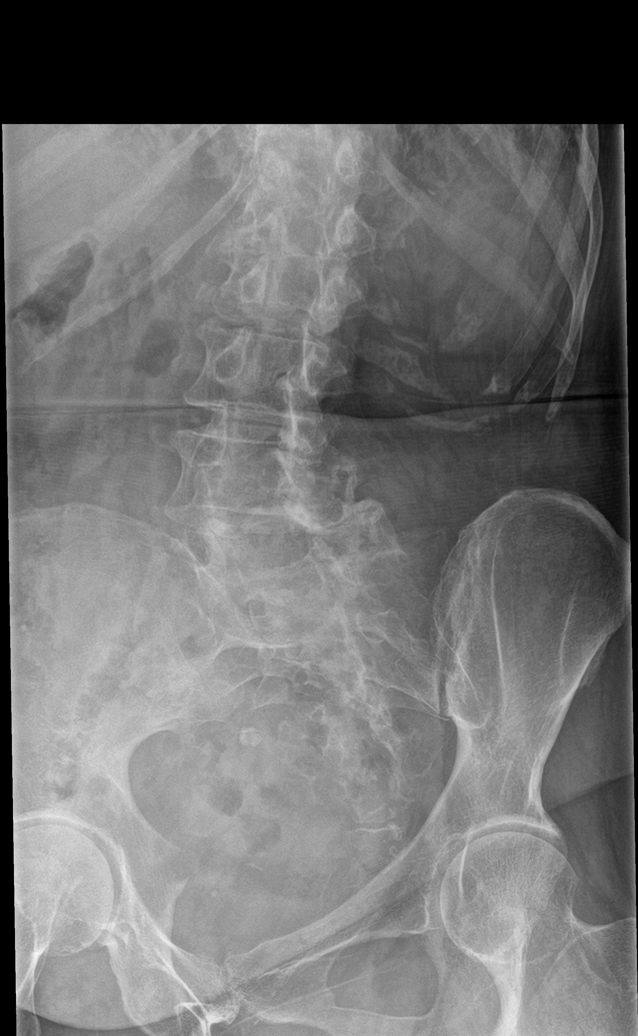

[l-spine obl (2 of 2)]
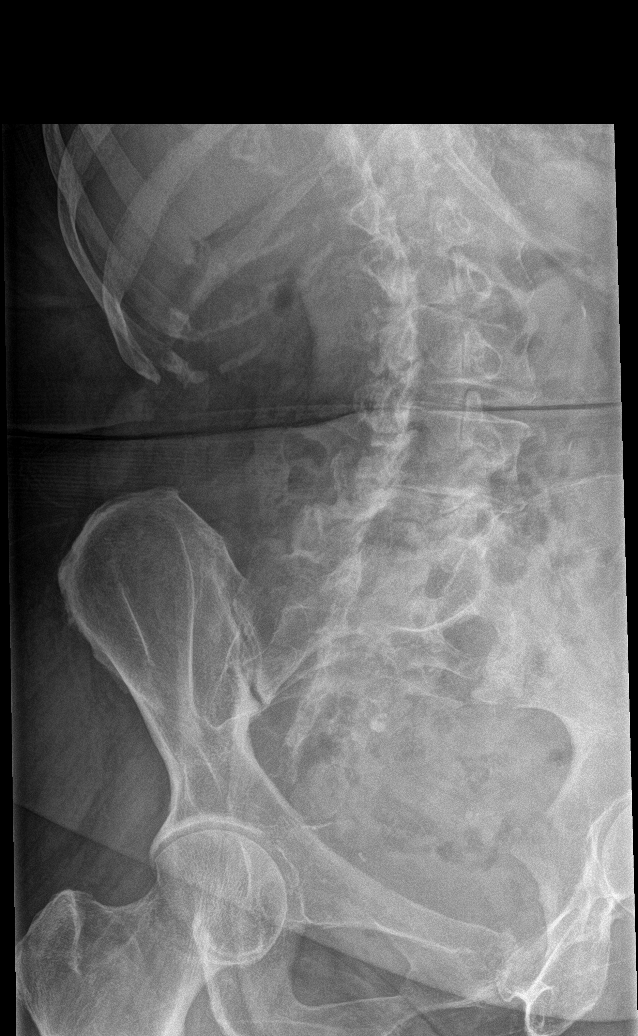

[l-spine lat]
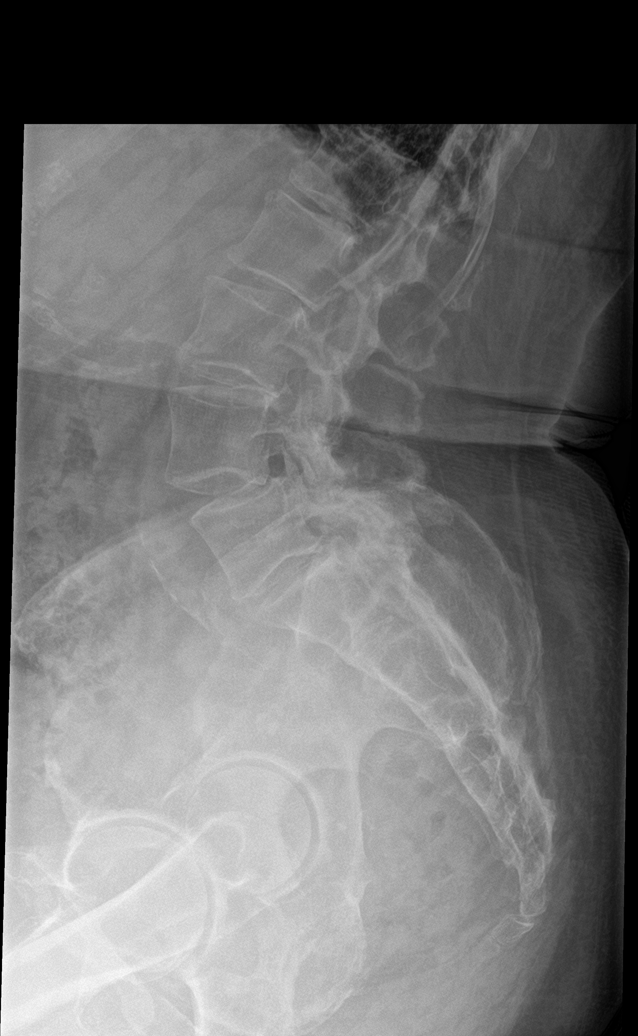

[l-spine spot]
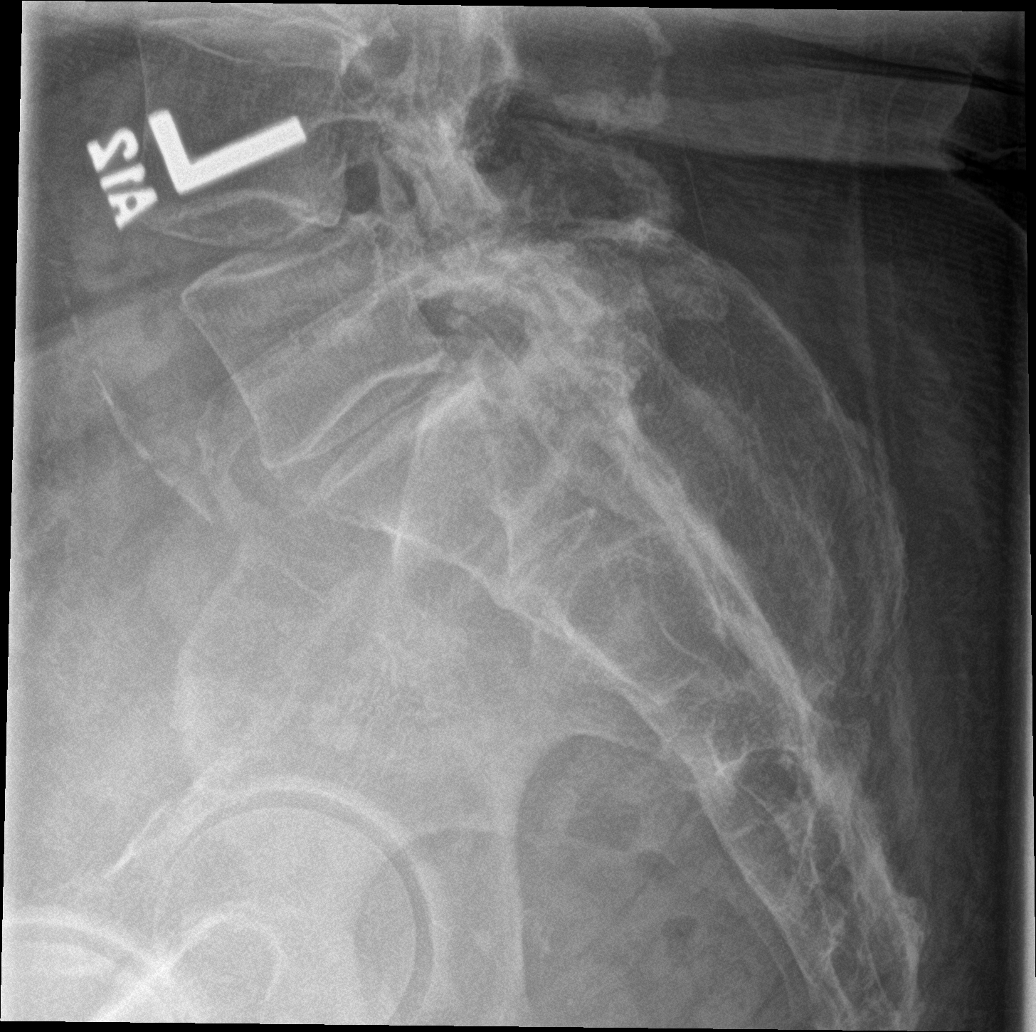

[5 of 5 positions shown; findings below may reference images not displayed]

FINDINGS: There is no evidence of lumbar spine fracture. Mild degenerative
disc disease seen at all lumbar levels. Moderate to severe
degenerative disc disease seen in the lower thoracic spine. Severe
facet DJD seen bilaterally at L4-5 and L5-S1. Grade 1 degenerative
anterolisthesis at L4-5 measures approximately 5 mm. Mild lumbar
levoscoliosis. Generalized osteopenia noted, however no focal lytic
or sclerotic bone lesions identified.
IMPRESSION: No acute findings.

Degenerative spondylosis, as described above, with grade 1
degenerative anterolisthesis at L4-5.

## 2018-07-07 ENCOUNTER — Other Ambulatory Visit (HOSPITAL_COMMUNITY): Payer: Self-pay | Admitting: Family Medicine

## 2018-07-07 DIAGNOSIS — Z1231 Encounter for screening mammogram for malignant neoplasm of breast: Secondary | ICD-10-CM

## 2018-07-08 ENCOUNTER — Ambulatory Visit (HOSPITAL_COMMUNITY)
Admission: RE | Admit: 2018-07-08 | Discharge: 2018-07-08 | Disposition: A | Discharge status: Home / Self Care | Payer: Medicare Other | Source: Ambulatory Visit | Attending: Family Medicine | Admitting: Family Medicine

## 2018-07-08 ENCOUNTER — Other Ambulatory Visit: Payer: Self-pay

## 2018-07-08 DIAGNOSIS — Z1231 Encounter for screening mammogram for malignant neoplasm of breast: Secondary | ICD-10-CM | POA: Insufficient documentation

## 2019-03-06 IMAGING — XA Imaging study
2 series · 2 of 2 positions shown · non-contrast
Comparison: None.

CLINICAL DATA: Lumbosacral spondylosis without myelopathy

[Series 1: ortho standard · 1 of 1 slices shown (1 of 2)]
[im 1/1]
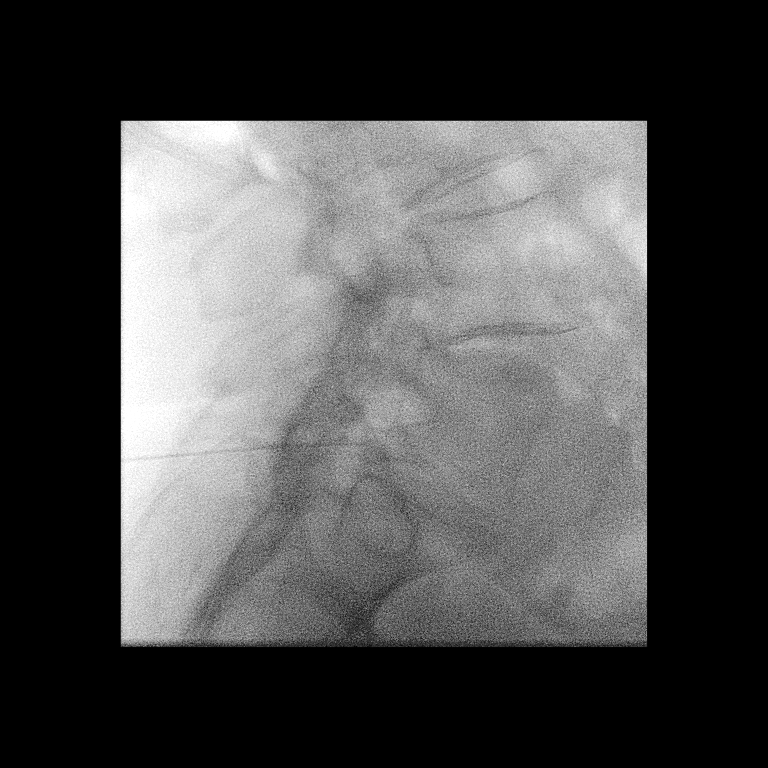

[Series 2: ortho standard · 1 of 1 slices shown (2 of 2)]
[im 1/1]
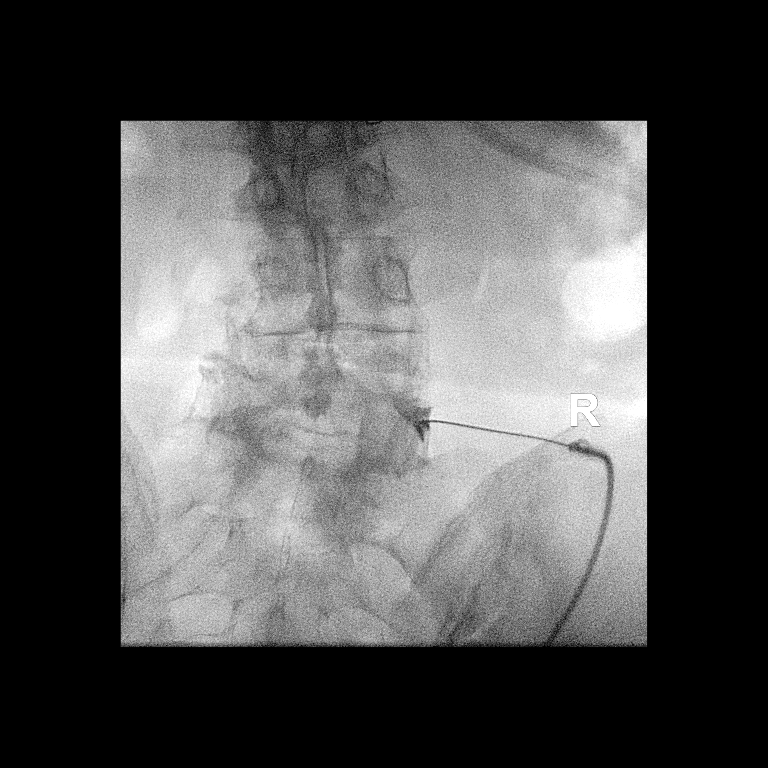

[2 of 2 positions shown; findings below may reference images not displayed]

EXAM:
DG INJECT/DAHIANNA INC NEEDLE/MASKA/JEUNESSE [place] EPI/GARANHUNS/SAC W/IMG

PROCEDURE:
The procedure, risks, benefits, and alternatives were explained to
the patient. Questions regarding the procedure were encouraged and
answered. The patient understands and consents to the procedure.

A posterior oblique approach was taken to the intervertebral foramen
on the right at L5-S1 using a curved 22 gauge spinal needle.
Injection of Isovue-M 200 outlined the right L5 nerve root and
showed good epidural spread. No vascular opacification is seen. One
hundred twentymg of Depo-Medrol mixed with 1 cc 1% lidocaine were
instilled. The procedure was well-tolerated, and the patient was
discharged thirty minutes following the injection in good condition.
IMPRESSION: Technically successful injection consisting of a right L5 nerve root
block and transforaminal epidural.

## 2022-01-22 ENCOUNTER — Other Ambulatory Visit (HOSPITAL_COMMUNITY): Payer: Self-pay | Admitting: Family Medicine

## 2022-01-22 DIAGNOSIS — M79604 Pain in right leg: Secondary | ICD-10-CM

## 2022-02-12 ENCOUNTER — Ambulatory Visit (HOSPITAL_COMMUNITY): Payer: Medicare HMO

## 2022-02-27 ENCOUNTER — Ambulatory Visit (HOSPITAL_COMMUNITY): Payer: Medicare HMO

## 2022-02-27 ENCOUNTER — Encounter (HOSPITAL_COMMUNITY): Payer: Self-pay

## 2022-05-20 ENCOUNTER — Ambulatory Visit
Admission: RE | Admit: 2022-05-20 | Discharge: 2022-05-20 | Disposition: A | Discharge status: Home / Self Care | Payer: Medicare HMO | Source: Ambulatory Visit | Attending: Family Medicine | Admitting: Family Medicine

## 2022-05-20 DIAGNOSIS — M79604 Pain in right leg: Secondary | ICD-10-CM

## 2022-06-06 ENCOUNTER — Other Ambulatory Visit: Payer: Self-pay | Admitting: Family Medicine

## 2022-06-06 DIAGNOSIS — R5381 Other malaise: Secondary | ICD-10-CM

## 2022-06-10 ENCOUNTER — Other Ambulatory Visit: Payer: Self-pay | Admitting: Family Medicine

## 2022-06-10 DIAGNOSIS — M5136 Other intervertebral disc degeneration, lumbar region: Secondary | ICD-10-CM

## 2022-07-01 ENCOUNTER — Other Ambulatory Visit: Payer: Medicare HMO

## 2022-07-01 NOTE — Discharge Instructions (Signed)

## 2022-07-02 ENCOUNTER — Ambulatory Visit
Admission: RE | Admit: 2022-07-02 | Discharge: 2022-07-02 | Disposition: A | Discharge status: Home / Self Care | Payer: Medicare HMO | Source: Ambulatory Visit | Attending: Family Medicine | Admitting: Family Medicine

## 2022-07-02 DIAGNOSIS — M5136 Other intervertebral disc degeneration, lumbar region: Secondary | ICD-10-CM

## 2022-07-02 MED ORDER — METHYLPREDNISOLONE ACETATE 40 MG/ML INJ SUSP (RADIOLOG
80.0000 mg | Freq: Once | INTRAMUSCULAR | Status: AC
  Administered 2022-07-02: 80 mg via EPIDURAL

## 2022-07-02 MED ORDER — IOPAMIDOL (ISOVUE-M 200) INJECTION 41%
1.0000 mL | Freq: Once | INTRAMUSCULAR | Status: AC
  Administered 2022-07-02: 1 mL via EPIDURAL

## 2022-08-05 ENCOUNTER — Encounter: Payer: Self-pay | Admitting: Internal Medicine

## 2022-08-05 ENCOUNTER — Ambulatory Visit (INDEPENDENT_AMBULATORY_CARE_PROVIDER_SITE_OTHER): Payer: Medicare HMO | Admitting: Internal Medicine

## 2022-08-05 VITALS — BP 149/69 | HR 94 | Ht 63.0 in | Wt 155.0 lb

## 2022-08-05 DIAGNOSIS — E559 Vitamin D deficiency, unspecified: Secondary | ICD-10-CM

## 2022-08-05 DIAGNOSIS — F1190 Opioid use, unspecified, uncomplicated: Secondary | ICD-10-CM | POA: Insufficient documentation

## 2022-08-05 DIAGNOSIS — E785 Hyperlipidemia, unspecified: Secondary | ICD-10-CM | POA: Diagnosis not present

## 2022-08-05 DIAGNOSIS — G8929 Other chronic pain: Secondary | ICD-10-CM

## 2022-08-05 DIAGNOSIS — I10 Essential (primary) hypertension: Secondary | ICD-10-CM

## 2022-08-05 DIAGNOSIS — M7918 Myalgia, other site: Secondary | ICD-10-CM | POA: Diagnosis not present

## 2022-08-05 DIAGNOSIS — R600 Localized edema: Secondary | ICD-10-CM

## 2022-08-05 DIAGNOSIS — M5431 Sciatica, right side: Secondary | ICD-10-CM

## 2022-08-05 NOTE — Assessment & Plan Note (Signed)
She endorses a history of chronic musculoskeletal pain in her right hip and right shoulder.  This has been managed with hydrocodone-acetaminophen 10-325 mg twice daily as well as 2 tablets every night.  This prescription has been managed by her previous PCP.  PDMP reviewed and is appropriate. -No medication changes have been made today.  Controlled substance agreement signed today.

## 2022-08-05 NOTE — Assessment & Plan Note (Signed)
BP is mildly elevated today, which she attributes to anxiety related to establishing care with a new provider.  She is currently prescribed amlodipine 5 mg daily, atenolol-chlorthalidone 50-25 mg daily, and losartan 50 mg daily. -No medication changes have been made today.  Continue to monitor BP at follow-up.

## 2022-08-05 NOTE — Assessment & Plan Note (Signed)
She endorses symptoms of sciatica that are managed with gabapentin 100 mg daily.  No medication changes have been made today.

## 2022-08-05 NOTE — Assessment & Plan Note (Signed)
She is currently prescribed hydrocodone-acetaminophen 10-325 mg for management of chronic musculoskeletal pain.  She reports that she has been on this medication for at least 2 years.  I reviewed with [name] my concern for chronic opioid use, particularly given her age.  No medication changes have been made for now and a controlled substance agreement has been signed.  Consider pain management referral at follow-up.

## 2022-08-05 NOTE — Patient Instructions (Signed)
It was a pleasure to see you today.  Thank you for giving [place] the opportunity to be involved in your care.  Below is a brief recap of your visit and next steps.  We will plan to see you again in 3 months.  Summary You have established care today Basic labs ordered We will plan for follow up in 3 months

## 2022-08-05 NOTE — Assessment & Plan Note (Signed)
She is prescribed torsemide 20 mg daily as needed for relief of lower extremity edema.  There is no edema present on exam currently.

## 2022-08-05 NOTE — Progress Notes (Signed)
New Patient Office Visit  Subjective    Patient ID: [name], female    DOB: 08-22-1931  Age: 89 y.o. MRN: [id number]  CC:  Chief Complaint  Patient presents with   Establish Care    HPI [name] presents to establish care.  She is an 89 year old woman who endorses a past medical history significant for HTN, chronic musculoskeletal pain, HLD, and lower extremity edema.  Previously followed by Dr. Sudie Bailey.  [name] reports feeling well today.  She endorses chronic musculoskeletal pain in her right shoulder and right hip.  She is otherwise asymptomatic and has no acute concerns to discuss today aside from desiring to establish care.  She denies a history of tobacco, alcohol, or illicit drug use.  Her family medical history is significant for CVA in both parents.  Chronic medical conditions and outstanding preventative care items discussed today are individually addressed A/P below.   Outpatient Encounter Medications as of 08/05/2022  Medication Sig   acetaminophen (TYLENOL) 500 MG tablet Take 500 mg by mouth daily.     amLODipine (NORVASC) 5 MG tablet Take 1 tablet (5 mg total) by mouth daily.   aspirin 81 MG chewable tablet Chew 81 mg by mouth daily.   atenolol-chlorthalidone (TENORETIC) 50-25 MG per tablet Take 1 tablet by mouth daily.     cholecalciferol (VITAMIN D3) 25 MCG (1000 UNIT) tablet Take 1,000 Units by mouth daily.   gabapentin (NEURONTIN) 100 MG capsule Take 100 mg by mouth daily.   HYDROcodone-acetaminophen (NORCO) 5-325 MG per tablet Take 1 tablet by mouth every 4 (four) hours as needed.  10-325. One tablet by mouth twice daily and 2 tablets by mouth every night at bedtime.   losartan (COZAAR) 50 MG tablet Take 50 mg by mouth daily.   potassium chloride (K-DUR,KLOR-CON) 10 MEQ tablet Take 20 mEq by mouth 2 (two) times daily.     pravastatin (PRAVACHOL) 40 MG tablet Take 40 mg by mouth at bedtime.     torsemide (DEMADEX) 20 MG tablet Take 20 mg  by mouth daily as needed (swelling).   [DISCONTINUED] losartan (COZAAR) 100 MG tablet Take 50 mg by mouth daily.   [DISCONTINUED] torsemide (DEMADEX) 100 MG tablet Take 20 mg by mouth daily.   [DISCONTINUED] esomeprazole (NEXIUM) 40 MG capsule Take 40 mg by mouth daily before breakfast.     [DISCONTINUED] naproxen (NAPROSYN) 500 MG tablet Take 500 mg by mouth 2 (two) times daily with a meal.     [DISCONTINUED] spironolactone (ALDACTONE) 25 MG tablet Take 25 mg by mouth daily.     No facility-administered encounter medications on file as of 08/05/2022.    Past Medical History:  Diagnosis Date   Allergic rhinitis    CHF (congestive heart failure) (HCC)    DJD (degenerative joint disease) of cervical spine    and lumbosacral   GERD (gastroesophageal reflux disease)    Hyperlipidemia    Hypertension    Obesity    Pulmonary embolism (HCC)    Rheumatic fever     Past Surgical History:  Procedure Laterality Date   HERNIA REPAIR     Possibly umbilical    Family History  Problem Relation Age of Onset   Stroke Mother    Stroke Father    Heart attack Brother    Cirrhosis Brother    Heart attack Sister     Social History   Socioeconomic History   Marital status: Widowed    Spouse name: Not on  file   Number of children: 1   Years of education: Not on file   Highest education level: Not on file  Occupational History   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  Tobacco Use   Smoking status: Never   Smokeless tobacco: Never  Vaping Use   Vaping status: Never Used  Substance and Sexual Activity   Alcohol use: No   Drug use: Never   Sexual activity: Not Currently  Other Topics Concern   Not on file  Social History Narrative   No caffeine   Social Determinants of Health   Financial Resource Strain: Not on file  Food Insecurity: Not on file  Transportation Needs: Not on file  Physical Activity: Not on file  Stress: Not on file  Social Connections: Not on file  Intimate  Partner Violence: Not on file   Review of Systems  Constitutional:  Negative for chills and fever.  HENT:  Negative for sore throat.   Respiratory:  Negative for cough and shortness of breath.   Cardiovascular:  Negative for chest pain, palpitations and leg swelling.  Gastrointestinal:  Negative for abdominal pain, blood in stool, constipation, diarrhea, nausea and vomiting.  Genitourinary:  Negative for dysuria and hematuria.  Musculoskeletal:  Positive for joint pain (Right shoulder, right hip). Negative for myalgias.  Skin:  Negative for itching and rash.  Neurological:  Negative for dizziness and headaches.  Psychiatric/Behavioral:  Negative for depression and suicidal ideas.    Objective    BP (!) 149/69   Pulse 94   Ht 5\' 3"  (1.6 m)   Wt 155 lb (70.3 kg)   SpO2 91%   BMI 27.46 kg/m   Physical Exam Vitals reviewed.  Constitutional:      General: She is not in acute distress.    Appearance: Normal appearance. She is not toxic-appearing.  HENT:     Head: Normocephalic and atraumatic.     Right Ear: External ear normal.     Left Ear: External ear normal.     Nose: Nose normal. No congestion or rhinorrhea.     Mouth/Throat:     Mouth: Mucous membranes are moist.     Pharynx: Oropharynx is clear. No oropharyngeal exudate or posterior oropharyngeal erythema.  Eyes:     General: No scleral icterus.    Extraocular Movements: Extraocular movements intact.     Conjunctiva/sclera: Conjunctivae normal.     Pupils: Pupils are equal, round, and reactive to light.  Cardiovascular:     Rate and Rhythm: Normal rate and regular rhythm.     Pulses: Normal pulses.     Heart sounds: Normal heart sounds. No murmur heard.    No friction rub. No gallop.  Pulmonary:     Effort: Pulmonary effort is normal.     Breath sounds: Normal breath sounds. No wheezing, rhonchi or rales.  Abdominal:     General: Abdomen is flat. Bowel sounds are normal. There is no distension.     Palpations:  Abdomen is soft.     Tenderness: There is no abdominal tenderness.  Musculoskeletal:        General: No swelling. Normal range of motion.     Cervical back: Normal range of motion.     Right lower leg: No edema.     Left lower leg: No edema.  Lymphadenopathy:     Cervical: No cervical adenopathy.  Skin:    General: Skin is warm and dry.     Capillary Refill: Capillary refill takes less  than 2 seconds.     Coloration: Skin is not jaundiced.  Neurological:     General: No focal deficit present.     Mental Status: She is alert and oriented to person, place, and time.  Psychiatric:        Mood and Affect: Mood normal.        Behavior: Behavior normal.    Assessment & Plan:   Problem List Items Addressed This Visit       Essential hypertension - Primary    BP is mildly elevated today, which she attributes to anxiety related to establishing care with a new provider.  She is currently prescribed amlodipine 5 mg daily, atenolol-chlorthalidone 50-25 mg daily, and losartan 50 mg daily. -No medication changes have been made today.  Continue to monitor BP at follow-up.      Sciatica of right side    She endorses symptoms of sciatica that are managed with gabapentin 100 mg daily.  No medication changes have been made today.      Hyperlipidemia    Currently prescribed pravastatin 40 mg daily.  Repeat lipid panel ordered today.      Chronic musculoskeletal pain    She endorses a history of chronic musculoskeletal pain in her right hip and right shoulder.  This has been managed with hydrocodone-acetaminophen 10-325 mg twice daily as well as 2 tablets every night.  This prescription has been managed by her previous PCP.  PDMP reviewed and is appropriate. -No medication changes have been made today.  Controlled substance agreement signed today.      Bilateral lower extremity edema    She is prescribed torsemide 20 mg daily as needed for relief of lower extremity edema.  There is no edema  present on exam currently.      Chronic, continuous use of opioids    She is currently prescribed hydrocodone-acetaminophen 10-325 mg for management of chronic musculoskeletal pain.  She reports that she has been on this medication for at least 2 years.  I reviewed with Ms. Veiga my concern for chronic opioid use, particularly given her age.  No medication changes have been made for now and a controlled substance agreement has been signed.  Consider pain management referral at follow-up.      Return in about 3 months (around 11/05/2022).   Billie Lade, MD

## 2022-08-05 NOTE — Assessment & Plan Note (Signed)
Currently prescribed pravastatin 40 mg daily.  Repeat lipid panel ordered today.

## 2022-08-06 LAB — CBC WITH DIFFERENTIAL/PLATELET
Basophils Absolute: 0 10*3/uL (ref 0.0–0.2)
Basos: 1 %
EOS (ABSOLUTE): 0.1 10*3/uL (ref 0.0–0.4)
Eos: 2 %
Hematocrit: 35.5 % (ref 34.0–46.6)
Hemoglobin: 11 g/dL — ABNORMAL LOW (ref 11.1–15.9)
Immature Grans (Abs): 0 10*3/uL (ref 0.0–0.1)
Immature Granulocytes: 1 %
Lymphocytes Absolute: 1.7 10*3/uL (ref 0.7–3.1)
Lymphs: 35 %
MCH: 26 pg — ABNORMAL LOW (ref 26.6–33.0)
MCHC: 31 g/dL — ABNORMAL LOW (ref 31.5–35.7)
MCV: 84 fL (ref 79–97)
Monocytes Absolute: 0.5 10*3/uL (ref 0.1–0.9)
Monocytes: 11 %
Neutrophils Absolute: 2.6 10*3/uL (ref 1.4–7.0)
Neutrophils: 50 %
Platelets: 231 10*3/uL (ref 150–450)
RBC: 4.23 x10E6/uL (ref 3.77–5.28)
RDW: 15.9 % — ABNORMAL HIGH (ref 11.7–15.4)
WBC: 5 10*3/uL (ref 3.4–10.8)

## 2022-08-06 LAB — TSH+FREE T4
Free T4: 1.26 ng/dL (ref 0.82–1.77)
TSH: 2.91 u[IU]/mL (ref 0.450–4.500)

## 2022-08-06 LAB — CMP14+EGFR
ALT: 15 IU/L (ref 0–32)
AST: 16 IU/L (ref 0–40)
Albumin: 4.2 g/dL (ref 3.7–4.7)
Alkaline Phosphatase: 55 IU/L (ref 44–121)
BUN/Creatinine Ratio: 16 (ref 12–28)
BUN: 16 mg/dL (ref 8–27)
Bilirubin Total: 0.3 mg/dL (ref 0.0–1.2)
CO2: 22 mmol/L (ref 20–29)
Calcium: 9.6 mg/dL (ref 8.7–10.3)
Chloride: 106 mmol/L (ref 96–106)
Creatinine, Ser: 1 mg/dL (ref 0.57–1.00)
Globulin, Total: 1.9 g/dL (ref 1.5–4.5)
Glucose: 93 mg/dL (ref 70–99)
Potassium: 3.5 mmol/L (ref 3.5–5.2)
Sodium: 144 mmol/L (ref 134–144)
Total Protein: 6.1 g/dL (ref 6.0–8.5)
eGFR: 54 mL/min/{1.73_m2} — ABNORMAL LOW (ref >59)

## 2022-08-06 LAB — B12 AND FOLATE PANEL
Folate: 17.1 ng/mL (ref >3.0)
Vitamin B-12: 408 pg/mL (ref 232–1245)

## 2022-08-06 LAB — LIPID PANEL
Chol/HDL Ratio: 3.2 ratio (ref 0.0–4.4)
Cholesterol, Total: 219 mg/dL — ABNORMAL HIGH (ref 100–199)
HDL: 69 mg/dL (ref >39)
LDL Chol Calc (NIH): 136 mg/dL — ABNORMAL HIGH (ref 0–99)
Triglycerides: 82 mg/dL (ref 0–149)
VLDL Cholesterol Cal: 14 mg/dL (ref 5–40)

## 2022-08-06 LAB — VITAMIN D 25 HYDROXY (VIT D DEFICIENCY, FRACTURES): Vit D, 25-Hydroxy: 33.3 ng/mL (ref 30.0–100.0)

## 2022-08-28 ENCOUNTER — Other Ambulatory Visit: Payer: Self-pay

## 2022-08-28 DIAGNOSIS — Z79899 Other long term (current) drug therapy: Secondary | ICD-10-CM

## 2022-09-19 ENCOUNTER — Other Ambulatory Visit: Payer: Self-pay | Admitting: Internal Medicine

## 2022-11-06 ENCOUNTER — Ambulatory Visit: Payer: Medicare HMO | Admitting: Internal Medicine

## 2022-11-19 ENCOUNTER — Ambulatory Visit (INDEPENDENT_AMBULATORY_CARE_PROVIDER_SITE_OTHER): Payer: Medicare HMO | Admitting: Internal Medicine

## 2022-11-19 ENCOUNTER — Encounter: Payer: Self-pay | Admitting: Internal Medicine

## 2022-11-19 VITALS — BP 141/70 | HR 70 | Wt 147.8 lb

## 2022-11-19 DIAGNOSIS — G8929 Other chronic pain: Secondary | ICD-10-CM

## 2022-11-19 DIAGNOSIS — M7918 Myalgia, other site: Secondary | ICD-10-CM | POA: Diagnosis not present

## 2022-11-19 MED ORDER — HYDROCODONE-ACETAMINOPHEN 5-325 MG PO TABS: 1.0000 | ORAL_TABLET | Freq: Two times a day (BID) | ORAL | 0 refills | Status: DC | PRN

## 2022-11-19 NOTE — Assessment & Plan Note (Signed)
She endorses chronic right hip and neck pain today.  Pain had previously been managed with hydrocodone-acetaminophen 10-325 mg twice daily as well as 2 tablets every night.  She ran out of her medication over a month ago and did not contact our office to request a refill.  She has attempted to manage her pain with Tylenol and gabapentin but pain remains poorly controlled. -Through shared decision making, hydrocodone-acetaminophen 10-325 mg has been refilled today at a frequency of every 12 hours as needed for severe pain relief x 60 tablets.  I recommended continued use of Tylenol and gabapentin for pain relief as well.

## 2022-11-19 NOTE — Progress Notes (Signed)
Established Patient Office Visit  Subjective   Patient ID: [name], female    DOB: 05-10-1931  Age: 89 y.o. MRN: [id number]  Chief Complaint  Patient presents with   Hypertension    Three month follow up    Neck Pain    Neck pain    Hip Pain    Hip pain    [name] returns to care today for routine follow-up.  She was last evaluated by me on 7/23 as a new patient presenting to establish care.  No medication changes were made at that time and 57-month follow-up was arranged.  There have been no acute interval events.  [name] reports feeling fairly well today.  She endorses chronic neck and right hip pain.  She states that she ran out of Norco over a month ago and has attempted to manage her pain with Tylenol and gabapentin since that time.  She does not have any acute concerns to discuss.  Past Medical History:  Diagnosis Date   Allergic rhinitis    CHF (congestive heart failure) (HCC)    DJD (degenerative joint disease) of cervical spine    and lumbosacral   GERD (gastroesophageal reflux disease)    Hyperlipidemia    Hypertension    Obesity    Pulmonary embolism (HCC)    Rheumatic fever    Past Surgical History:  Procedure Laterality Date   HERNIA REPAIR     Possibly umbilical   Social History   Tobacco Use   Smoking status: Never   Smokeless tobacco: Never  Vaping Use   Vaping status: Never Used  Substance Use Topics   Alcohol use: No   Drug use: Never   Family History  Problem Relation Age of Onset   Stroke Mother    Stroke Father    Heart attack Brother    Cirrhosis Brother    Heart attack Sister    No Known Allergies  Review of Systems  Musculoskeletal:  Positive for joint pain (R hip) and neck pain.  All other systems reviewed and are negative.    Objective:     BP (!) 141/70 (BP Location: Right Arm, Patient Position: Sitting, Cuff Size: Large)   Pulse 70   Wt 147 lb 12.8 oz (67 kg)   SpO2 92%   BMI 26.18 kg/m  BP  Readings from Last 3 Encounters:  11/19/22 (!) 141/70  08/05/22 (!) 149/69  07/02/22 (!) 167/84   Physical Exam Vitals reviewed.  Constitutional:      General: She is not in acute distress.    Appearance: Normal appearance. She is not toxic-appearing.  HENT:     Head: Normocephalic and atraumatic.     Right Ear: External ear normal.     Left Ear: External ear normal.     Nose: Nose normal. No congestion or rhinorrhea.     Mouth/Throat:     Mouth: Mucous membranes are moist.     Pharynx: Oropharynx is clear. No oropharyngeal exudate or posterior oropharyngeal erythema.  Eyes:     General: No scleral icterus.    Extraocular Movements: Extraocular movements intact.     Conjunctiva/sclera: Conjunctivae normal.     Pupils: Pupils are equal, round, and reactive to light.  Cardiovascular:     Rate and Rhythm: Normal rate and regular rhythm.     Pulses: Normal pulses.     Heart sounds: Normal heart sounds. No murmur heard.    No friction rub. No gallop.  Pulmonary:  Effort: Pulmonary effort is normal.     Breath sounds: Normal breath sounds. No wheezing, rhonchi or rales.  Abdominal:     General: Abdomen is flat. Bowel sounds are normal. There is no distension.     Palpations: Abdomen is soft.     Tenderness: There is no abdominal tenderness.  Musculoskeletal:        General: No swelling. Normal range of motion.     Cervical back: Normal range of motion.     Right lower leg: No edema.     Left lower leg: No edema.  Lymphadenopathy:     Cervical: No cervical adenopathy.  Skin:    General: Skin is warm and dry.     Capillary Refill: Capillary refill takes less than 2 seconds.     Coloration: Skin is not jaundiced.  Neurological:     General: No focal deficit present.     Mental Status: She is alert and oriented to person, place, and time.     Gait: Gait abnormal (ambulates with cane).  Psychiatric:        Mood and Affect: Mood normal.        Behavior: Behavior normal.    Last CBC Lab Results  Component Value Date   WBC 5.0 08/05/2022   HGB 11.0 (L) 08/05/2022   HCT 35.5 08/05/2022   MCV 84 08/05/2022   MCH 26.0 (L) 08/05/2022   RDW 15.9 (H) 08/05/2022   PLT 231 08/05/2022   Last metabolic panel Lab Results  Component Value Date   GLUCOSE 93 08/05/2022   NA 144 08/05/2022   K 3.5 08/05/2022   CL 106 08/05/2022   CO2 22 08/05/2022   BUN 16 08/05/2022   CREATININE 1.00 08/05/2022   EGFR 54 (L) 08/05/2022   CALCIUM 9.6 08/05/2022   PROT 6.1 08/05/2022   ALBUMIN 4.2 08/05/2022   LABGLOB 1.9 08/05/2022   BILITOT 0.3 08/05/2022   ALKPHOS 55 08/05/2022   AST 16 08/05/2022   ALT 15 08/05/2022   ANIONGAP 10 10/30/2017   Last lipids Lab Results  Component Value Date   CHOL 219 (H) 08/05/2022   HDL 69 08/05/2022   LDLCALC 136 (H) 08/05/2022   TRIG 82 08/05/2022   CHOLHDL 3.2 08/05/2022   Last thyroid functions Lab Results  Component Value Date   TSH 2.910 08/05/2022   Last vitamin D Lab Results  Component Value Date   VD25OH 33.3 08/05/2022   Last vitamin B12 and Folate Lab Results  Component Value Date   VITAMINB12 408 08/05/2022   FOLATE 17.1 08/05/2022     Assessment & Plan:   Problem List Items Addressed This Visit       Chronic musculoskeletal pain - Primary    She endorses chronic right hip and neck pain today.  Pain had previously been managed with hydrocodone-acetaminophen 10-325 mg twice daily as well as 2 tablets every night.  She ran out of her medication over a month ago and did not contact our office to request a refill.  She has attempted to manage her pain with Tylenol and gabapentin but pain remains poorly controlled. -Through shared decision making, hydrocodone-acetaminophen 10-325 mg has been refilled today at a frequency of every 12 hours as needed for severe pain relief x 60 tablets.  I recommended continued use of Tylenol and gabapentin for pain relief as well.      Return in about 3 months (around  02/19/2023).   Meredith Lade, MD

## 2022-11-19 NOTE — Patient Instructions (Signed)
It was a pleasure to see you today.  Thank you for giving [place] the opportunity to be involved in your care.  Below is a brief recap of your visit and next steps.  We will plan to see you again in 3 months.  Summary Hydrocodone-acetaminophen refilled as every 12 hours as needed for severe pain relief OK to use tylenol and gabapentin  Follow up in 3 month

## 2023-02-19 ENCOUNTER — Ambulatory Visit (INDEPENDENT_AMBULATORY_CARE_PROVIDER_SITE_OTHER): Payer: Medicare HMO | Admitting: Internal Medicine

## 2023-02-19 ENCOUNTER — Encounter: Payer: Self-pay | Admitting: Internal Medicine

## 2023-02-19 VITALS — BP 164/82 | HR 73 | Ht 60.0 in | Wt 143.0 lb

## 2023-02-19 DIAGNOSIS — M7918 Myalgia, other site: Secondary | ICD-10-CM

## 2023-02-19 DIAGNOSIS — I10 Essential (primary) hypertension: Secondary | ICD-10-CM

## 2023-02-19 DIAGNOSIS — G8929 Other chronic pain: Secondary | ICD-10-CM

## 2023-02-19 MED ORDER — AMLODIPINE BESYLATE 10 MG PO TABS: 10.0000 mg | ORAL_TABLET | Freq: Every day | ORAL | 3 refills | Status: DC

## 2023-02-19 NOTE — Assessment & Plan Note (Signed)
 Blood pressure is elevated today.  She is currently prescribed amlodipine  5 mg daily, atenolol -chlorthalidone  50-25 mg daily, and losartan  50 mg daily.  She endorses compliance with her currently prescribed regimen. -Increase amlodipine  to 10 mg daily -Follow-up in 6 weeks for reassessment

## 2023-02-19 NOTE — Assessment & Plan Note (Signed)
 Her acute concern today is chronic right hip and right lumbar back pain.  She has a known history of lumbosacral spondylosis with severe spinal stenosis at L4-5.  She has previously received an epidural steroid injection, last completed in June 2024.  She is attempting to control pain with hydrocodone -acetaminophen  10-325 mg 4 times daily.  This was previously effective in alleviating her pain, but pain has recently worsened.  PDMP reviewed.  She last filled a prescription for hydrocodone -acetaminophen  from me in November and more recently filled a prescription from her previous PCP in late December.  We reviewed that she should not fill any prescriptions written by her previous provider going forward.  She states that she does not need a refill currently.  Going forward, will prescribe hydrocodone -acetaminophen  10-325 mg 4 times daily as needed for severe pain relief.  Will also arrange repeat epidural steroid injection.  Follow-up in 6 weeks for reassessment.

## 2023-02-19 NOTE — Patient Instructions (Signed)
 It was a pleasure to see you today.  Thank you for giving us  the opportunity to be involved in your care.  Below is a brief recap of your visit and next steps.  We will plan to see you again in 6 weeks.  Summary Increase amlodipine  to 10 mg daily Will arrange repeat steroid injection in your lower back Follow up in 6 weeks

## 2023-02-19 NOTE — Progress Notes (Signed)
 Established Patient Office Visit  Subjective   Patient ID: JAMILLA GALLI, female    DOB: 1931/10/19  Age: 89 y.o. MRN: [id number]  Chief Complaint  Patient presents with   Care Management    Three month follow up    Ms. Woodfin returns to care today for routine follow-up.  She was last evaluated by me in November 2024.  Hydrocodone -acetaminophen  refilled and 6-month follow-up arranged.  There have been no acute interval events.  Today Ms. Williams and endorses chronic right lumbar back and hip pain.  Pain remains poorly controlled despite taking hydrocodone -acetaminophen  as prescribed.  She previously received a lumbar epidural steroid injection in June 2024, which was effective in alleviating her pain.  She is interested in receiving additional treatment.  Past Medical History:  Diagnosis Date   Allergic rhinitis    CHF (congestive heart failure) (HCC)    DJD (degenerative joint disease) of cervical spine    and lumbosacral   GERD (gastroesophageal reflux disease)    Hyperlipidemia    Hypertension    Obesity    Pulmonary embolism (HCC)    Rheumatic fever    Past Surgical History:  Procedure Laterality Date   HERNIA REPAIR     Possibly umbilical   Social History   Tobacco Use   Smoking status: Never   Smokeless tobacco: Never  Vaping Use   Vaping status: Never Used  Substance Use Topics   Alcohol use: No   Drug use: Never   Family History  Problem Relation Age of Onset   Stroke Mother    Stroke Father    Heart attack Brother    Cirrhosis Brother    Heart attack Sister    No Known Allergies  Review of Systems  Musculoskeletal:  Positive for back pain (Right lumbar region) and joint pain (Right hip).  All other systems reviewed and are negative.     Objective:     BP (!) 164/82   Pulse 73   Ht 5' (1.524 m)   Wt 143 lb (64.9 kg)   SpO2 93%   BMI 27.93 kg/m  BP Readings from Last 3 Encounters:  02/19/23 (!) 164/82  11/19/22 (!) 141/70   08/05/22 (!) 149/69   Physical Exam Vitals reviewed.  Constitutional:      General: She is not in acute distress.    Appearance: Normal appearance. She is not toxic-appearing.  HENT:     Head: Normocephalic and atraumatic.     Right Ear: External ear normal.     Left Ear: External ear normal.     Nose: Nose normal. No congestion or rhinorrhea.     Mouth/Throat:     Mouth: Mucous membranes are moist.     Pharynx: Oropharynx is clear. No oropharyngeal exudate or posterior oropharyngeal erythema.  Eyes:     General: No scleral icterus.    Extraocular Movements: Extraocular movements intact.     Conjunctiva/sclera: Conjunctivae normal.     Pupils: Pupils are equal, round, and reactive to light.  Cardiovascular:     Rate and Rhythm: Normal rate and regular rhythm.     Pulses: Normal pulses.     Heart sounds: Normal heart sounds. No murmur heard.    No friction rub. No gallop.  Pulmonary:     Effort: Pulmonary effort is normal.     Breath sounds: Normal breath sounds. No wheezing, rhonchi or rales.  Abdominal:     General: Abdomen is flat. Bowel sounds are normal. There is  no distension.     Palpations: Abdomen is soft.     Tenderness: There is no abdominal tenderness.  Musculoskeletal:        General: No swelling.     Cervical back: Normal range of motion.     Right lower leg: No edema.     Left lower leg: No edema.  Lymphadenopathy:     Cervical: No cervical adenopathy.  Skin:    General: Skin is warm and dry.     Capillary Refill: Capillary refill takes less than 2 seconds.     Coloration: Skin is not jaundiced.  Neurological:     General: No focal deficit present.     Mental Status: She is alert and oriented to person, place, and time.     Gait: Gait abnormal (ambulates with cane).  Psychiatric:        Mood and Affect: Mood normal.        Behavior: Behavior normal.   Last CBC Lab Results  Component Value Date   WBC 5.0 08/05/2022   HGB 11.0 (L) 08/05/2022   HCT  35.5 08/05/2022   MCV 84 08/05/2022   MCH 26.0 (L) 08/05/2022   RDW 15.9 (H) 08/05/2022   PLT 231 08/05/2022   Last metabolic panel Lab Results  Component Value Date   GLUCOSE 93 08/05/2022   NA 144 08/05/2022   K 3.5 08/05/2022   CL 106 08/05/2022   CO2 22 08/05/2022   BUN 16 08/05/2022   CREATININE 1.00 08/05/2022   EGFR 54 (L) 08/05/2022   CALCIUM 9.6 08/05/2022   PROT 6.1 08/05/2022   ALBUMIN 4.2 08/05/2022   LABGLOB 1.9 08/05/2022   BILITOT 0.3 08/05/2022   ALKPHOS 55 08/05/2022   AST 16 08/05/2022   ALT 15 08/05/2022   ANIONGAP 10 10/30/2017   Last lipids Lab Results  Component Value Date   CHOL 219 (H) 08/05/2022   HDL 69 08/05/2022   LDLCALC 136 (H) 08/05/2022   TRIG 82 08/05/2022   CHOLHDL 3.2 08/05/2022   Last thyroid functions Lab Results  Component Value Date   TSH 2.910 08/05/2022   Last vitamin D  Lab Results  Component Value Date   VD25OH 33.3 08/05/2022   Last vitamin B12 and Folate Lab Results  Component Value Date   VITAMINB12 408 08/05/2022   FOLATE 17.1 08/05/2022     Assessment & Plan:   Problem List Items Addressed This Visit       Essential hypertension - Primary   Blood pressure is elevated today.  She is currently prescribed amlodipine  5 mg daily, atenolol -chlorthalidone  50-25 mg daily, and losartan  50 mg daily.  She endorses compliance with her currently prescribed regimen. -Increase amlodipine  to 10 mg daily -Follow-up in 6 weeks for reassessment      Chronic musculoskeletal pain   Her acute concern today is chronic right hip and right lumbar back pain.  She has a known history of lumbosacral spondylosis with severe spinal stenosis at L4-5.  She has previously received an epidural steroid injection, last completed in June 2024.  She is attempting to control pain with hydrocodone -acetaminophen  10-325 mg 4 times daily.  This was previously effective in alleviating her pain, but pain has recently worsened.  PDMP reviewed.  She  last filled a prescription for hydrocodone -acetaminophen  from me in November and more recently filled a prescription from her previous PCP in late December.  We reviewed that she should not fill any prescriptions written by her previous provider going forward.  She states that she does not need a refill currently.  Going forward, will prescribe hydrocodone -acetaminophen  10-325 mg 4 times daily as needed for severe pain relief.  Will also arrange repeat epidural steroid injection.  Follow-up in 6 weeks for reassessment.       Return in about 6 weeks (around 04/02/2023).    Manus FORBES Fireman, MD

## 2023-04-09 ENCOUNTER — Encounter: Payer: Self-pay | Admitting: Internal Medicine

## 2023-04-09 ENCOUNTER — Ambulatory Visit (INDEPENDENT_AMBULATORY_CARE_PROVIDER_SITE_OTHER): Payer: Medicare HMO | Admitting: Internal Medicine

## 2023-04-09 VITALS — BP 120/62 | HR 68 | Ht 60.0 in | Wt 137.2 lb

## 2023-04-09 DIAGNOSIS — I10 Essential (primary) hypertension: Secondary | ICD-10-CM | POA: Diagnosis not present

## 2023-04-09 DIAGNOSIS — G8929 Other chronic pain: Secondary | ICD-10-CM | POA: Diagnosis not present

## 2023-04-09 DIAGNOSIS — M5431 Sciatica, right side: Secondary | ICD-10-CM | POA: Diagnosis not present

## 2023-04-09 DIAGNOSIS — M7918 Myalgia, other site: Secondary | ICD-10-CM | POA: Diagnosis not present

## 2023-04-09 MED ORDER — HYDROCODONE-ACETAMINOPHEN 10-325 MG PO TABS: 1.0000 | ORAL_TABLET | Freq: Two times a day (BID) | ORAL | 0 refills | Status: AC | PRN

## 2023-04-09 MED ORDER — AMLODIPINE BESYLATE 5 MG PO TABS: 5.0000 mg | ORAL_TABLET | Freq: Every day | ORAL | 3 refills | Status: AC

## 2023-04-09 NOTE — Patient Instructions (Signed)
 It was a pleasure to see you today.  Thank you for giving [place] the opportunity to be involved in your care.  Below is a brief recap of your visit and next steps.  We will plan to see you again in 3 months.  Summary No medication changes today Continue amlodipine 5 mg daily, refill provided Pain medicine refilled Will arrange repeat steroid injection Follow up in 3 months

## 2023-04-09 NOTE — Progress Notes (Signed)
 Established Patient Office Visit  Subjective   Patient ID: [name], female    DOB: 1931-08-10  Age: 89 y.o. MRN: [id number]  Chief Complaint  Patient presents with   Hypertension    Six week follow up    Numbness    Numbness in right hip down to the feet   [name] returns to care today for follow-up of HTN and chronic musculoskeletal pain.  She was last evaluated by me on 2/6 at which time amlodipine  was increased to 10 mg daily for improved HTN control.  Hydrocodone -acetaminophen  was refilled for management of chronic musculoskeletal pain.  I also ordered repeat epidural steroid injection as this has been effective previously.  There have been no acute interval events.  Today she continues to endorse lumbar back pain.  She has not been able to schedule repeat epidural injection.  Her blood pressure is controlled today, however she has continued to take amlodipine  5 mg and did not increase to 10 mg.  She does not have any additional concerns to discuss.  Past Medical History:  Diagnosis Date   Allergic rhinitis    CHF (congestive heart failure) (HCC)    DJD (degenerative joint disease) of cervical spine    and lumbosacral   GERD (gastroesophageal reflux disease)    Hyperlipidemia    Hypertension    Obesity    Pulmonary embolism (HCC)    Rheumatic fever    Past Surgical History:  Procedure Laterality Date   HERNIA REPAIR     Possibly umbilical   Social History   Tobacco Use   Smoking status: Never   Smokeless tobacco: Never  Vaping Use   Vaping status: Never Used  Substance Use Topics   Alcohol use: No   Drug use: Never   Family History  Problem Relation Age of Onset   Stroke Mother    Stroke Father    Heart attack Brother    Cirrhosis Brother    Heart attack Sister    No Known Allergies  Review of Systems  Musculoskeletal:  Positive for back pain (Right lumbar pain) and joint pain (Right hip).  All other systems reviewed and are  negative.    Objective:     BP 120/62   Pulse 68   Ht 5' (1.524 m)   Wt 137 lb 3.2 oz (62.2 kg)   SpO2 94%   BMI 26.80 kg/m  BP Readings from Last 3 Encounters:  04/09/23 120/62  02/19/23 (!) 164/82   Physical Exam Vitals reviewed.  Constitutional:      General: She is not in acute distress.    Appearance: Normal appearance. She is not toxic-appearing.  HENT:     Head: Normocephalic and atraumatic.     Right Ear: External ear normal.     Left Ear: External ear normal.     Nose: Nose normal. No congestion or rhinorrhea.     Mouth/Throat:     Mouth: Mucous membranes are moist.     Pharynx: Oropharynx is clear. No oropharyngeal exudate or posterior oropharyngeal erythema.  Eyes:     General: No scleral icterus.    Extraocular Movements: Extraocular movements intact.     Conjunctiva/sclera: Conjunctivae normal.     Pupils: Pupils are equal, round, and reactive to light.  Cardiovascular:     Rate and Rhythm: Normal rate and regular rhythm.     Pulses: Normal pulses.     Heart sounds: Normal heart sounds. No murmur heard.  No friction rub. No gallop.  Pulmonary:     Effort: Pulmonary effort is normal.     Breath sounds: Normal breath sounds. No wheezing, rhonchi or rales.  Abdominal:     General: Abdomen is flat. Bowel sounds are normal. There is no distension.     Palpations: Abdomen is soft.     Tenderness: There is no abdominal tenderness.  Musculoskeletal:        General: No swelling.     Cervical back: Normal range of motion.     Right lower leg: No edema.     Left lower leg: No edema.  Lymphadenopathy:     Cervical: No cervical adenopathy.  Skin:    General: Skin is warm and dry.     Capillary Refill: Capillary refill takes less than 2 seconds.     Coloration: Skin is not jaundiced.  Neurological:     General: No focal deficit present.     Mental Status: She is alert and oriented to person, place, and time.     Gait: Gait abnormal (ambulates with cane).   Psychiatric:        Mood and Affect: Mood normal.        Behavior: Behavior normal.   Last CBC Lab Results  Component Value Date   WBC 5.0 08/05/2022   HGB 11.0 (L) 08/05/2022   HCT 35.5 08/05/2022   MCV 84 08/05/2022   MCH 26.0 (L) 08/05/2022   RDW 15.9 (H) 08/05/2022   PLT 231 08/05/2022   Last metabolic panel Lab Results  Component Value Date   GLUCOSE 93 08/05/2022   NA 144 08/05/2022   K 3.5 08/05/2022   CL 106 08/05/2022   CO2 22 08/05/2022   BUN 16 08/05/2022   CREATININE 1.00 08/05/2022   EGFR 54 (L) 08/05/2022   CALCIUM 9.6 08/05/2022   PROT 6.1 08/05/2022   ALBUMIN 4.2 08/05/2022   LABGLOB 1.9 08/05/2022   BILITOT 0.3 08/05/2022   ALKPHOS 55 08/05/2022   AST 16 08/05/2022   ALT 15 08/05/2022   ANIONGAP 10 10/30/2017   Last lipids Lab Results  Component Value Date   CHOL 219 (H) 08/05/2022   HDL 69 08/05/2022   LDLCALC 136 (H) 08/05/2022   TRIG 82 08/05/2022   CHOLHDL 3.2 08/05/2022   Last thyroid functions Lab Results  Component Value Date   TSH 2.910 08/05/2022   Last vitamin D  Lab Results  Component Value Date   VD25OH 33.3 08/05/2022   Last vitamin B12 and Folate Lab Results  Component Value Date   VITAMINB12 408 08/05/2022   FOLATE 17.1 08/05/2022     Assessment & Plan:   Problem List Items Addressed This Visit       Essential hypertension   Returning to care today for HTN follow-up.  Her blood pressure today is 120/62.  Amlodipine  was increased to 10 mg daily at her last appointment, however she has continued to take 5 mg.  She is additionally prescribed atenolol -chlorthalidone  50-25 mg daily and losartan 50 mg daily.  No medication changes are indicated at this time.  Continue amlodipine  5 mg daily, refill provided today, and additional medications as previously prescribed.      Chronic musculoskeletal pain - Primary   She continues to endorse chronic right hip and right lumbar back pain.  She has a known history of lumbosacral  spondylosis with severe spinal stenosis at L4-5.  She has previously received epidural steroid injections, last in June 2024, which have been  effective in alleviating her pain.  She is additionally prescribed hydrocodone -acetaminophen  10-325 mg 4 times daily as needed for severe pain relief.  We attempted to arrange repeat epidural steroid injection following her last appointment but this has not been scheduled.  Will place new orders today.  Hydrocodone -acetaminophen  refilled.      Return in about 3 months (around 07/10/2023).   Tobi Fortes, MD

## 2023-04-20 NOTE — Discharge Instructions (Signed)

## 2023-04-21 ENCOUNTER — Ambulatory Visit
Admission: RE | Admit: 2023-04-21 | Discharge: 2023-04-21 | Disposition: A | Discharge status: Home / Self Care | Source: Ambulatory Visit | Attending: Internal Medicine

## 2023-04-21 DIAGNOSIS — M5431 Sciatica, right side: Secondary | ICD-10-CM

## 2023-04-21 MED ORDER — IOPAMIDOL (ISOVUE-M 200) INJECTION 41%
1.0000 mL | Freq: Once | INTRAMUSCULAR | Status: AC
  Administered 2023-04-21: 1 mL via EPIDURAL

## 2023-04-21 MED ORDER — METHYLPREDNISOLONE ACETATE 40 MG/ML INJ SUSP (RADIOLOG
80.0000 mg | Freq: Once | INTRAMUSCULAR | Status: AC
  Administered 2023-04-21: 80 mg via EPIDURAL

## 2023-05-06 ENCOUNTER — Telehealth: Payer: Self-pay | Admitting: Internal Medicine

## 2023-05-06 ENCOUNTER — Other Ambulatory Visit: Payer: Self-pay

## 2023-05-06 MED ORDER — ATENOLOL-CHLORTHALIDONE 50-25 MG PO TABS: 1.0000 | ORAL_TABLET | Freq: Every day | ORAL | 0 refills | Status: DC

## 2023-05-06 MED ORDER — GABAPENTIN 100 MG PO CAPS: 100.0000 mg | ORAL_CAPSULE | Freq: Every day | ORAL | 0 refills | Status: DC

## 2023-05-06 NOTE — Telephone Encounter (Signed)
 Refills sent to pharmacy.

## 2023-05-06 NOTE — Telephone Encounter (Signed)
 Patient no longer get from Dr Hillary Lowing office   Prescription Request  05/06/2023  LOV: 04/09/2023  What is the name of the medication or equipment? atenolol -chlorthalidone  (TENORETIC ) 50-25 MG per tablet   gabapentin  (NEURONTIN ) 100 MG capsule [[id number]]   Have you contacted your pharmacy to request a refill? Yes   Which pharmacy would you like this sent to?  [facility] [phone] - [place], [place] - [address] AT [facility] OF [address] [place] [address] Phone: [phone] Fax: [phone]    Patient notified that their request is being sent to the clinical staff for review and that they should receive a response within 2 business days.   Please advise at  walked into office

## 2023-05-20 ENCOUNTER — Encounter: Payer: Self-pay | Admitting: Internal Medicine

## 2023-05-20 NOTE — Assessment & Plan Note (Signed)
 She continues to endorse chronic right hip and right lumbar back pain.  She has a known history of lumbosacral spondylosis with severe spinal stenosis at L4-5.  She has previously received epidural steroid injections, last in June 2024, which have been effective in alleviating her pain.  She is additionally prescribed hydrocodone -acetaminophen  10-325 mg 4 times daily as needed for severe pain relief.  We attempted to arrange repeat epidural steroid injection following her last appointment but this has not been scheduled.  Will place new orders today.  Hydrocodone -acetaminophen  refilled.

## 2023-05-20 NOTE — Assessment & Plan Note (Signed)
 Returning to care today for HTN follow-up.  Her blood pressure today is 120/62.  Amlodipine  was increased to 10 mg daily at her last appointment, however she has continued to take 5 mg.  She is additionally prescribed atenolol -chlorthalidone  50-25 mg daily and losartan 50 mg daily.  No medication changes are indicated at this time.  Continue amlodipine  5 mg daily, refill provided today, and additional medications as previously prescribed.

## 2023-06-22 ENCOUNTER — Telehealth: Payer: Self-pay | Admitting: Internal Medicine

## 2023-06-22 DIAGNOSIS — G8929 Other chronic pain: Secondary | ICD-10-CM

## 2023-06-22 NOTE — Telephone Encounter (Signed)
 Copied from CRM [id number]. Topic: Clinical - Medication Refill >> Jun 22, 2023  2:30 PM Everette C wrote: Medication: HYDROcodone -acetaminophen  (NORCO) 10-325 MG tablet [[id number]]  Has the patient contacted their pharmacy? Yes (Agent: If no, request that the patient contact the pharmacy for the refill. If patient does not wish to contact the pharmacy document the reason why and proceed with request.) (Agent: If yes, when and what did the pharmacy advise?)  This is the patient's preferred pharmacy:  [facility] [phone] - [place], [place] - [address] AT [facility] [address] [place] [address] Phone: [phone] Fax: [phone]  Is this the correct pharmacy for this prescription? Yes If no, delete pharmacy and type the correct one.   Has the prescription been filled recently? Yes  Is the patient out of the medication? Yes  Has the patient been seen for an appointment in the last year OR does the patient have an upcoming appointment? Yes  Can we respond through MyChart? No  Agent: Please be advised that Rx refills may take up to 3 business days. We ask that you follow-up with your pharmacy.

## 2023-06-23 MED ORDER — HYDROCODONE-ACETAMINOPHEN 10-325 MG PO TABS: 1.0000 | ORAL_TABLET | Freq: Two times a day (BID) | ORAL | 0 refills | Status: AC | PRN

## 2023-06-23 NOTE — Telephone Encounter (Signed)
 Patient advised.

## 2023-07-10 ENCOUNTER — Ambulatory Visit (INDEPENDENT_AMBULATORY_CARE_PROVIDER_SITE_OTHER)

## 2023-07-10 VITALS — BP 134/72 | HR 77 | Ht 60.0 in | Wt 141.0 lb

## 2023-07-10 DIAGNOSIS — I10 Essential (primary) hypertension: Secondary | ICD-10-CM | POA: Diagnosis not present

## 2023-07-10 DIAGNOSIS — M25511 Pain in right shoulder: Secondary | ICD-10-CM

## 2023-07-10 MED ORDER — GABAPENTIN 100 MG PO CAPS: 100.0000 mg | ORAL_CAPSULE | Freq: Two times a day (BID) | ORAL | 1 refills | Status: DC

## 2023-07-10 NOTE — Assessment & Plan Note (Signed)
 Chronic in nature, but she reports an increase in pain.  Will increase gabapentin  to twice a day.  She takes 1 hydrocodone  a day for chronic pain.  Will refer back to Ortho for possible steroid injection in shoulder for pain relief.  Patient is in agreement with this plan.

## 2023-07-10 NOTE — Assessment & Plan Note (Signed)
 BP is well-controlled at this time.  No adjustments in medication.  Recommend follow-up in 3 months or sooner if blood pressure remains above 140/90.

## 2023-07-10 NOTE — Progress Notes (Signed)
 Established Patient Office Visit  Subjective   Patient ID: [name], female    DOB: 1931-04-29  Age: 89 y.o. MRN: [id number]  Chief Complaint  Patient presents with   Medical Management of Chronic Issues    3 month follow up    HPI  Her amlodipine  was increased to 10 mg at last office visit she is here today to reevaluate blood pressure.  Pain  She reports chronic right shoulder pain. was not an injury that may have caused the pain. The pain started several months ago and is gradually worsening. The pain does not radiate. The pain is described as aching and throbbing, is moderate and 6/10 in intensity, occurring constantly. Symptoms are worse in the: evening  Aggravating factors: use of right arm Relieving factors: none.  She has tried acetaminophen  and prescription pain relievers with little relief.   --------------------------------------------------------------------------------------------------- Patient Active Problem List   Diagnosis Date Noted   Essential hypertension 08/05/2022   Chronic musculoskeletal pain 08/05/2022   Sciatica of right side 08/05/2022   Bilateral lower extremity edema 08/05/2022   Chronic, continuous use of opioids 08/05/2022   PULMONARY EMBOLISM 08/07/2009   Hyperlipidemia 07/05/2009   OBESITY 07/05/2009   GASTROESOPHAGEAL REFLUX DISEASE 07/05/2009   DYSPNEA 07/05/2009   IMPINGEMENT SYNDROME 06/21/2009   SHOULDER PAIN 01/18/2009    ROS    Objective:     BP 134/72 (BP Location: Left Arm, Patient Position: Sitting, Cuff Size: Normal)   Pulse 77   Ht 5' (1.524 m)   Wt 141 lb (64 kg)   SpO2 91%   BMI 27.54 kg/m  BP Readings from Last 3 Encounters:  07/10/23 134/72  04/21/23 (!) 172/73  04/09/23 120/62   Wt Readings from Last 3 Encounters:  07/10/23 141 lb (64 kg)  04/09/23 137 lb 3.2 oz (62.2 kg)  02/19/23 143 lb (64.9 kg)      Physical Exam Vitals and nursing note reviewed.  Constitutional:      Appearance:  Normal appearance.   Eyes:     Extraocular Movements: Extraocular movements intact.     Pupils: Pupils are equal, round, and reactive to light.    Cardiovascular:     Rate and Rhythm: Normal rate and regular rhythm.  Pulmonary:     Effort: Pulmonary effort is normal.     Breath sounds: Normal breath sounds.   Musculoskeletal:     Right shoulder: Tenderness present. Decreased range of motion. Decreased strength.     Left shoulder: Normal.     Cervical back: Normal range of motion and neck supple.   Neurological:     Mental Status: She is alert and oriented to person, place, and time.   Psychiatric:        Mood and Affect: Mood normal.        Thought Content: Thought content normal.    No results found for any visits on 07/10/23.  Last CBC Lab Results  Component Value Date   WBC 5.0 08/05/2022   HGB 11.0 (L) 08/05/2022   HCT 35.5 08/05/2022   MCV 84 08/05/2022   MCH 26.0 (L) 08/05/2022   RDW 15.9 (H) 08/05/2022   PLT 231 08/05/2022   Last metabolic panel Lab Results  Component Value Date   GLUCOSE 93 08/05/2022   NA 144 08/05/2022   K 3.5 08/05/2022   CL 106 08/05/2022   CO2 22 08/05/2022   BUN 16 08/05/2022   CREATININE 1.00 08/05/2022   EGFR 54 (L) 08/05/2022  CALCIUM 9.6 08/05/2022   PROT 6.1 08/05/2022   ALBUMIN 4.2 08/05/2022   LABGLOB 1.9 08/05/2022   BILITOT 0.3 08/05/2022   ALKPHOS 55 08/05/2022   AST 16 08/05/2022   ALT 15 08/05/2022   ANIONGAP 10 10/30/2017   Last lipids Lab Results  Component Value Date   CHOL 219 (H) 08/05/2022   HDL 69 08/05/2022   LDLCALC 136 (H) 08/05/2022   TRIG 82 08/05/2022   CHOLHDL 3.2 08/05/2022   Last hemoglobin A1c No results found for: HGBA1C    The ASCVD Risk score (Arnett DK, et al., 2019) failed to calculate for the following reasons:   The 2019 ASCVD risk score is only valid for ages 70 to 38    Assessment & Plan:   Problem List Items Addressed This Visit       Cardiovascular and  Mediastinum   Essential hypertension - Primary   BP is well-controlled at this time.  No adjustments in medication.  Recommend follow-up in 3 months or sooner if blood pressure remains above 140/90.        Other   SHOULDER PAIN   Chronic in nature, but she reports an increase in pain.  Will increase gabapentin  to twice a day.  She takes 1 hydrocodone  a day for chronic pain.  Will refer back to Ortho for possible steroid injection in shoulder for pain relief.  Patient is in agreement with this plan.      Relevant Medications   gabapentin  (NEURONTIN ) 100 MG capsule   Other Relevant Orders   Ambulatory referral to Orthopedic Surgery    Return in about 3 months (around 10/10/2023) for chronic follow-up with PCP.    Leita Longs, FNP

## 2023-08-04 ENCOUNTER — Other Ambulatory Visit: Payer: Self-pay | Admitting: Internal Medicine

## 2023-10-09 ENCOUNTER — Ambulatory Visit

## 2023-10-09 VITALS — BP 136/70 | HR 58 | Ht 60.0 in | Wt 139.1 lb

## 2023-10-09 DIAGNOSIS — G8929 Other chronic pain: Secondary | ICD-10-CM

## 2023-10-09 DIAGNOSIS — M25511 Pain in right shoulder: Secondary | ICD-10-CM | POA: Diagnosis not present

## 2023-10-09 DIAGNOSIS — F1190 Opioid use, unspecified, uncomplicated: Secondary | ICD-10-CM | POA: Diagnosis not present

## 2023-10-09 DIAGNOSIS — I10 Essential (primary) hypertension: Secondary | ICD-10-CM | POA: Diagnosis not present

## 2023-10-09 DIAGNOSIS — M7918 Myalgia, other site: Secondary | ICD-10-CM | POA: Diagnosis not present

## 2023-10-09 MED ORDER — LOSARTAN POTASSIUM 50 MG PO TABS: 50.0000 mg | ORAL_TABLET | Freq: Every day | ORAL | 3 refills | Status: DC

## 2023-10-09 MED ORDER — HYDROCODONE-ACETAMINOPHEN 10-325 MG PO TABS: 1.0000 | ORAL_TABLET | Freq: Four times a day (QID) | ORAL | 0 refills | Status: AC | PRN

## 2023-10-09 NOTE — Progress Notes (Signed)
 Established Patient Office Visit  Subjective   Patient ID: [name], female    DOB: February 09, 1931  Age: 89 y.o. MRN: [id number]  Chief Complaint  Patient presents with   Medical Management of Chronic Issues    3 month follow up    HPI Discussed the use of AI scribe software for clinical note transcription with the patient, who gave verbal consent to proceed.  History of Present Illness   [name] is a 89 year old female who presents with shoulder pain.  Shoulder pain - Persistent shoulder pain for the past two to three years - Received previous treatments including injections in the hip and back while seeking care in [place] - Unable to locate previous orthopedic physician  Neck pain - Neck pain with unclear etiology - Gabapentin  dose increased, providing some relief but pain persists  Pain management - Requires refills for tetracaine and hydrocodone  for ongoing pain management      Patient Active Problem List   Diagnosis Date Noted   Essential hypertension 08/05/2022   Chronic musculoskeletal pain 08/05/2022   Sciatica of right side 08/05/2022   Bilateral lower extremity edema 08/05/2022   Chronic, continuous use of opioids 08/05/2022   PULMONARY EMBOLISM 08/07/2009   Hyperlipidemia 07/05/2009   OBESITY 07/05/2009   GASTROESOPHAGEAL REFLUX DISEASE 07/05/2009   DYSPNEA 07/05/2009   IMPINGEMENT SYNDROME 06/21/2009   SHOULDER PAIN 01/18/2009      ROS    Objective:     BP 136/70 (BP Location: Left Arm, Patient Position: Sitting, Cuff Size: Normal)   Pulse (!) 58   Ht 5' (1.524 m)   Wt 139 lb 1.9 oz (63.1 kg)   SpO2 94%   BMI 27.17 kg/m  BP Readings from Last 3 Encounters:  10/09/23 136/70  07/10/23 134/72  04/21/23 (!) 172/73   Wt Readings from Last 3 Encounters:  10/09/23 139 lb 1.9 oz (63.1 kg)  07/10/23 141 lb (64 kg)  04/09/23 137 lb 3.2 oz (62.2 kg)      Physical Exam Vitals and nursing note reviewed.   Constitutional:      Appearance: Normal appearance.  HENT:     Head: Normocephalic.  Eyes:     Extraocular Movements: Extraocular movements intact.     Pupils: Pupils are equal, round, and reactive to light.  Cardiovascular:     Rate and Rhythm: Normal rate and regular rhythm.  Pulmonary:     Effort: Pulmonary effort is normal.     Breath sounds: Normal breath sounds.  Musculoskeletal:     Right shoulder: Bony tenderness present. Decreased range of motion. Decreased strength.     Left shoulder: Normal.     Cervical back: Rigidity present. Pain with movement present. Decreased range of motion.  Neurological:     Mental Status: She is alert and oriented to person, place, and time.  Psychiatric:        Mood and Affect: Mood normal.        Thought Content: Thought content normal.      No results found for any visits on 10/09/23.    The ASCVD Risk score (Arnett DK, et al., 2019) failed to calculate for the following reasons:   The 2019 ASCVD risk score is only valid for ages 65 to 35    Assessment & Plan:   Problem List Items Addressed This Visit       Cardiovascular and Mediastinum   Essential hypertension - Primary   BP is well-controlled at this  time.  No adjustments in medication.  Recommend follow-up in 3 months or sooner if blood pressure remains above 140/90.      Relevant Medications   losartan  (COZAAR ) 50 MG tablet     Other   SHOULDER PAIN   Persistent pain with previous ineffective treatment. Previously managed with injections by Dr. Elnor.   - Contact Dr. Kevin clinic for further management.      Relevant Medications   HYDROcodone -acetaminophen  (NORCO) 10-325 MG tablet   Chronic musculoskeletal pain   Hydrocodone  refilled for chronic musculoskeletal pain.  PDMP reviewed.        Relevant Medications   HYDROcodone -acetaminophen  (NORCO) 10-325 MG tablet   Chronic, continuous use of opioids   She is currently prescribed hydrocodone -acetaminophen   10-325 mg for management of chronic musculoskeletal pain.  She reports that she has been on this medication for at least 2 years.   She will make an appointment with Dr. Landy, who has given her an injection for pain in the past.       Relevant Medications   HYDROcodone -acetaminophen  (NORCO) 10-325 MG tablet        Return in about 6 months (around 04/07/2024) for chronic follow-up with PCP.    Leita Longs, FNP

## 2023-10-11 NOTE — Assessment & Plan Note (Signed)
 BP is well-controlled at this time.  No adjustments in medication.  Recommend follow-up in 3 months or sooner if blood pressure remains above 140/90.

## 2023-10-11 NOTE — Assessment & Plan Note (Signed)
 She is currently prescribed hydrocodone -acetaminophen  10-325 mg for management of chronic musculoskeletal pain.  She reports that she has been on this medication for at least 2 years.   She will make an appointment with Dr. Landy, who has given her an injection for pain in the past.

## 2023-10-11 NOTE — Assessment & Plan Note (Signed)
 Hydrocodone  refilled for chronic musculoskeletal pain.  PDMP reviewed.

## 2023-10-11 NOTE — Assessment & Plan Note (Signed)
 Persistent pain with previous ineffective treatment. Previously managed with injections by Dr. Elnor.   - Contact Dr. Kevin clinic for further management.

## 2023-11-11 ENCOUNTER — Other Ambulatory Visit: Payer: Self-pay

## 2024-01-28 ENCOUNTER — Other Ambulatory Visit: Payer: Self-pay

## 2024-01-28 DIAGNOSIS — M25511 Pain in right shoulder: Secondary | ICD-10-CM

## 2024-02-03 ENCOUNTER — Other Ambulatory Visit: Payer: Self-pay

## 2024-02-03 ENCOUNTER — Telehealth: Payer: Self-pay

## 2024-02-03 DIAGNOSIS — I10 Essential (primary) hypertension: Secondary | ICD-10-CM

## 2024-02-03 MED ORDER — LOSARTAN POTASSIUM 50 MG PO TABS: 50.0000 mg | ORAL_TABLET | Freq: Every day | ORAL | 3 refills | Status: AC

## 2024-02-03 NOTE — Telephone Encounter (Signed)
 Copied from CRM #[id number]. Topic: Clinical - Prescription Issue >> Feb 03, 2024 12:52 PM Willma R wrote: Reason for CRM: Jodi for [employer] calling on the behalf of the patient to advise she has a benefit with her insurance for 100 day refills and is requesting to see if the prescription for losartan  (COZAAR ) 50 MG tablet can be sent for 100 days to: [facility] [phone] - [place], [place] - [address] AT [facility] [address] [place] [address] Phone: [phone] Fax: [phone]  Myla can be reached at [phone]

## 2024-02-19 ENCOUNTER — Other Ambulatory Visit: Payer: Self-pay

## 2024-04-08 ENCOUNTER — Ambulatory Visit
